# Patient Record
Sex: Female | Born: 1955 | Race: Black or African American | Hispanic: No | Marital: Single | Smoking: Never smoker
Health system: Southern US, Community
[De-identification: ages and names within clinical notes are randomized; demographics above are authoritative.]

## PROBLEM LIST (undated history)

## (undated) DIAGNOSIS — I10 Essential (primary) hypertension: Secondary | ICD-10-CM

## (undated) DIAGNOSIS — B20 Human immunodeficiency virus [HIV] disease: Secondary | ICD-10-CM

## (undated) DIAGNOSIS — Z21 Asymptomatic human immunodeficiency virus [HIV] infection status: Secondary | ICD-10-CM

## (undated) DIAGNOSIS — M1990 Unspecified osteoarthritis, unspecified site: Secondary | ICD-10-CM

## (undated) DIAGNOSIS — K759 Inflammatory liver disease, unspecified: Secondary | ICD-10-CM

## (undated) HISTORY — PX: ABDOMINAL HYSTERECTOMY: SHX81

---

## 2007-10-03 ENCOUNTER — Emergency Department (HOSPITAL_COMMUNITY): Admission: EM | Admit: 2007-10-03 | Discharge: 2007-10-03 | Payer: Self-pay | Admitting: Emergency Medicine

## 2010-01-23 ENCOUNTER — Observation Stay (HOSPITAL_COMMUNITY): Admission: EM | Admit: 2010-01-23 | Discharge: 2010-01-24 | Payer: Self-pay | Admitting: Emergency Medicine

## 2010-11-15 LAB — URINALYSIS, ROUTINE W REFLEX MICROSCOPIC
Nitrite: NEGATIVE
Protein, ur: 30 mg/dL — AB

## 2010-11-15 LAB — POCT CARDIAC MARKERS
CKMB, poc: 1 ng/mL (ref 1.0–8.0)
CKMB, poc: <1 ng/mL — ABNORMAL LOW (ref 1.0–8.0)
Myoglobin, poc: 118 ng/mL (ref 12–200)

## 2010-11-15 LAB — COMPREHENSIVE METABOLIC PANEL
ALT: 26 U/L (ref 0–35)
Alkaline Phosphatase: 100 U/L (ref 39–117)
BUN: 7 mg/dL (ref 6–23)
GFR calc non Af Amer: >60 mL/min (ref >60)
Potassium: 3.4 mEq/L — ABNORMAL LOW (ref 3.5–5.1)
Sodium: 135 mEq/L (ref 135–145)
Total Bilirubin: 0.1 mg/dL — ABNORMAL LOW (ref 0.3–1.2)

## 2010-11-15 LAB — POCT I-STAT, CHEM 8
Glucose, Bld: 112 mg/dL — ABNORMAL HIGH (ref 70–99)
HCT: 44 % (ref 36.0–46.0)
Hemoglobin: 15 g/dL (ref 12.0–15.0)
Potassium: 3.7 mEq/L (ref 3.5–5.1)
Sodium: 140 mEq/L (ref 135–145)

## 2010-11-15 LAB — CBC
Hemoglobin: 13.4 g/dL (ref 12.0–15.0)
MCHC: 34.1 g/dL (ref 30.0–36.0)
MCV: 92.3 fL (ref 78.0–100.0)
RBC: 4.25 MIL/uL (ref 3.87–5.11)
WBC: 4.9 10*3/uL (ref 4.0–10.5)

## 2010-11-15 LAB — SALICYLATE LEVEL: Salicylate Lvl: <4 mg/dL (ref 2.8–20.0)

## 2010-11-15 LAB — LIPID PANEL
Total CHOL/HDL Ratio: 2.4 RATIO
VLDL: 13 mg/dL (ref 0–40)

## 2010-11-15 LAB — HEMOGLOBIN A1C: Hgb A1c MFr Bld: 5.8 % — ABNORMAL HIGH (ref <5.7)

## 2010-11-15 LAB — RAPID URINE DRUG SCREEN, HOSP PERFORMED
Barbiturates: NOT DETECTED
Opiates: POSITIVE — AB
Tetrahydrocannabinol: NOT DETECTED

## 2010-11-15 LAB — URINE CULTURE
Colony Count: NO GROWTH
Culture: NO GROWTH

## 2010-11-15 LAB — DIFFERENTIAL
Basophils Relative: 0 % (ref 0–1)
Eosinophils Absolute: 0 10*3/uL (ref 0.0–0.7)
Eosinophils Relative: 1 % (ref 0–5)
Lymphocytes Relative: 26 % (ref 12–46)
Neutro Abs: 3.4 10*3/uL (ref 1.7–7.7)
Neutrophils Relative %: 69 % (ref 43–77)

## 2010-11-15 LAB — URINE MICROSCOPIC-ADD ON

## 2010-11-15 LAB — CARDIAC PANEL(CRET KIN+CKTOT+MB+TROPI)
CK, MB: 0.9 ng/mL (ref 0.3–4.0)
Total CK: 107 U/L (ref 7–177)

## 2010-11-15 LAB — ETHANOL: Alcohol, Ethyl (B): 190 mg/dL — ABNORMAL HIGH (ref 0–10)

## 2010-11-15 LAB — APTT: aPTT: 28 seconds (ref 24–37)

## 2013-11-28 ENCOUNTER — Other Ambulatory Visit: Payer: Self-pay | Admitting: Pain Medicine

## 2013-11-28 DIAGNOSIS — M545 Low back pain, unspecified: Secondary | ICD-10-CM

## 2013-11-30 ENCOUNTER — Ambulatory Visit
Admission: RE | Admit: 2013-11-30 | Discharge: 2013-11-30 | Disposition: A | Discharge status: Home / Self Care | Payer: Managed Care, Other (non HMO) | Source: Ambulatory Visit | Attending: Pain Medicine | Admitting: Pain Medicine

## 2013-11-30 DIAGNOSIS — M545 Low back pain, unspecified: Secondary | ICD-10-CM

## 2013-12-09 ENCOUNTER — Other Ambulatory Visit: Payer: Self-pay | Admitting: Orthopedic Surgery

## 2013-12-20 ENCOUNTER — Ambulatory Visit (HOSPITAL_COMMUNITY)
Admission: RE | Admit: 2013-12-20 | Discharge: 2013-12-20 | Disposition: A | Discharge status: Home / Self Care | Payer: Managed Care, Other (non HMO) | Source: Ambulatory Visit | Attending: Orthopedic Surgery | Admitting: Orthopedic Surgery

## 2013-12-20 ENCOUNTER — Encounter (HOSPITAL_COMMUNITY): Payer: Self-pay

## 2013-12-20 ENCOUNTER — Encounter (HOSPITAL_COMMUNITY)
Admission: RE | Admit: 2013-12-20 | Discharge: 2013-12-20 | Disposition: A | Discharge status: Home / Self Care | Payer: Managed Care, Other (non HMO) | Source: Ambulatory Visit | Attending: Orthopedic Surgery | Admitting: Orthopedic Surgery

## 2013-12-20 DIAGNOSIS — Z01812 Encounter for preprocedural laboratory examination: Secondary | ICD-10-CM | POA: Insufficient documentation

## 2013-12-20 DIAGNOSIS — Z01810 Encounter for preprocedural cardiovascular examination: Secondary | ICD-10-CM | POA: Insufficient documentation

## 2013-12-20 DIAGNOSIS — Z01818 Encounter for other preprocedural examination: Secondary | ICD-10-CM | POA: Insufficient documentation

## 2013-12-20 HISTORY — DX: Essential (primary) hypertension: I10

## 2013-12-20 HISTORY — DX: Inflammatory liver disease, unspecified: K75.9

## 2013-12-20 HISTORY — DX: Unspecified osteoarthritis, unspecified site: M19.90

## 2013-12-20 HISTORY — DX: Human immunodeficiency virus (HIV) disease: B20

## 2013-12-20 HISTORY — DX: Asymptomatic human immunodeficiency virus (hiv) infection status: Z21

## 2013-12-20 LAB — COMPREHENSIVE METABOLIC PANEL
ALT: 36 U/L — ABNORMAL HIGH (ref 0–35)
AST: 60 U/L — AB (ref 0–37)
Albumin: 3.2 g/dL — ABNORMAL LOW (ref 3.5–5.2)
Alkaline Phosphatase: 124 U/L — ABNORMAL HIGH (ref 39–117)
BUN: 11 mg/dL (ref 6–23)
CALCIUM: 9.7 mg/dL (ref 8.4–10.5)
CO2: 29 meq/L (ref 19–32)
CREATININE: 0.7 mg/dL (ref 0.50–1.10)
Chloride: 100 mEq/L (ref 96–112)
Glucose, Bld: 87 mg/dL (ref 70–99)
Potassium: 4.4 mEq/L (ref 3.7–5.3)
Sodium: 138 mEq/L (ref 137–147)
Total Bilirubin: <0.2 mg/dL — ABNORMAL LOW (ref 0.3–1.2)
Total Protein: 9.1 g/dL — ABNORMAL HIGH (ref 6.0–8.3)

## 2013-12-20 LAB — PROTIME-INR
INR: 1.02 (ref 0.00–1.49)
Prothrombin Time: 13.2 seconds (ref 11.6–15.2)

## 2013-12-20 LAB — URINALYSIS, ROUTINE W REFLEX MICROSCOPIC
Bilirubin Urine: NEGATIVE
Glucose, UA: NEGATIVE mg/dL
Ketones, ur: NEGATIVE mg/dL
Leukocytes, UA: NEGATIVE
Nitrite: NEGATIVE
Protein, ur: NEGATIVE mg/dL
SPECIFIC GRAVITY, URINE: 1.01 (ref 1.005–1.030)
UROBILINOGEN UA: 0.2 mg/dL (ref 0.0–1.0)
pH: 7.5 (ref 5.0–8.0)

## 2013-12-20 LAB — CBC WITH DIFFERENTIAL/PLATELET
BASOS ABS: 0 10*3/uL (ref 0.0–0.1)
Basophils Relative: 0 % (ref 0–1)
EOS PCT: 2 % (ref 0–5)
Eosinophils Absolute: 0.1 10*3/uL (ref 0.0–0.7)
HEMATOCRIT: 38.7 % (ref 36.0–46.0)
Hemoglobin: 13.1 g/dL (ref 12.0–15.0)
LYMPHS PCT: 38 % (ref 12–46)
Lymphs Abs: 2.5 10*3/uL (ref 0.7–4.0)
MCH: 32.6 pg (ref 26.0–34.0)
MCHC: 33.9 g/dL (ref 30.0–36.0)
MCV: 96.3 fL (ref 78.0–100.0)
MONO ABS: 0.6 10*3/uL (ref 0.1–1.0)
Monocytes Relative: 9 % (ref 3–12)
Neutro Abs: 3.4 10*3/uL (ref 1.7–7.7)
Neutrophils Relative %: 51 % (ref 43–77)
Platelets: 230 10*3/uL (ref 150–400)
RBC: 4.02 MIL/uL (ref 3.87–5.11)
RDW: 14.3 % (ref 11.5–15.5)
WBC: 6.6 10*3/uL (ref 4.0–10.5)

## 2013-12-20 LAB — SURGICAL PCR SCREEN
MRSA, PCR: NEGATIVE
STAPHYLOCOCCUS AUREUS: NEGATIVE

## 2013-12-20 LAB — URINE MICROSCOPIC-ADD ON

## 2013-12-20 LAB — TYPE AND SCREEN
ABO/RH(D): O POS
Antibody Screen: NEGATIVE

## 2013-12-20 LAB — ABO/RH: ABO/RH(D): O POS

## 2013-12-20 LAB — APTT: aPTT: 35 seconds (ref 24–37)

## 2013-12-20 NOTE — Pre-Procedure Instructions (Addendum)
Flara Hammerschmidt  12/20/2013   Your procedure is scheduled on:  Thursday, April 30.  [facility] / Entrance "A" at 5:30 AM.  Call this number if you have problems the morning of surgery: [phone]   Remember:   Do not eat food or drink liquids after midnight Wednesday, April 29.   Take these medicines the morning of surgery with A SIP OF WATER: - Amlodipine.  Take if needed Pain medication.   Do not wear jewelry, make-up or nail polish.  Do not wear lotions, powders, or perfumes.  Do not shave 48 hours prior to surgery.   Do not bring valuables to the hospital.  [facility] is not responsible  for any belongings or valuables.               Contacts, dentures or bridgework may not be worn into surgery.  Leave suitcase in the car. After surgery it may be brought to your room.  For patients admitted to the hospital, discharge time is determined by your  treatment team.                 Special Instructions: Review  [facility] - Preparing For Surgery.   Please read over the following fact sheets that you were given: Pain Booklet, Coughing and Deep Breathing, Blood Transfusion Information and Surgical Site Infection Prevention, Incentive Spirometry

## 2013-12-25 MED ORDER — CEFAZOLIN SODIUM-DEXTROSE 2-3 GM-% IV SOLR
2.0000 g | INTRAVENOUS | Status: AC
  Administered 2013-12-26 (×2): 2 g via INTRAVENOUS
  Filled 2013-12-25: qty 50

## 2013-12-25 MED ORDER — POVIDONE-IODINE 7.5 % EX SOLN
Freq: Once | CUTANEOUS | Status: DC
  Filled 2013-12-25: qty 118

## 2013-12-25 NOTE — H&P (Signed)
PREOPERATIVE H&P  Chief Complaint: left > right leg pain  HPI: [name] is a 58 y.o. female who presents with ongoing leg pain for 3 years. The patient has failed multiple forms of conservative care. MRI show DDD at 5/1 and L > R stenosis.   Past Medical History  Diagnosis Date  . Hypertension   . Arthritis   . Hepatitis   . HIV (human immunodeficiency virus infection)    Past Surgical History  Procedure Laterality Date  . Abdominal hysterectomy     History   Social History  . Marital Status: Single    Spouse Name: N/A    Number of Children: N/A  . Years of Education: N/A   Social History Main Topics  . Smoking status: Never Smoker   . Smokeless tobacco: Not on file  . Alcohol Use: No  . Drug Use: No  . Sexual Activity: Not on file   Other Topics Concern  . Not on file   Social History Narrative  . No narrative on file   No family history on file. Allergies  Allergen Reactions  . Sulfa Antibiotics Other (See Comments)    Face turned red and made her feel very uncomfortable   Prior to Admission medications   Medication Sig Start Date End Date Taking? Authorizing Provider  amLODipine (NORVASC) 5 MG tablet Take 5 mg by mouth every morning.    Historical Provider, MD  Ascorbic Acid (VITAMIN C PO) Take 1 tablet by mouth daily.    Historical Provider, MD  azaTHIOprine (IMURAN) 50 MG tablet Take 50 mg by mouth 2 (two) times daily.    Historical Provider, MD  benazepril-hydrochlorthiazide (LOTENSIN HCT) 20-12.5 MG per tablet Take 2 tablets by mouth daily with breakfast.    Historical Provider, MD  Brimonidine Tartrate-Timolol (COMBIGAN OP) Place 1 drop into both eyes 2 (two) times daily.    Historical Provider, MD  efavirenz-emtricitabine-tenofovir (ATRIPLA) 600-200-300 MG per tablet Take 1 tablet by mouth at bedtime.    Historical Provider, MD  estrogens, conjugated, (PREMARIN) 0.625 MG tablet Take 0.625 mg by mouth daily. Take daily for 21 days then do not take  for 7 days.    Historical Provider, MD  fexofenadine (ALLEGRA) 180 MG tablet Take 180 mg by mouth daily as needed for allergies or rhinitis.    Historical Provider, MD  fluticasone (FLONASE) 50 MCG/ACT nasal spray Place 2 sprays into both nostrils daily as needed for allergies or rhinitis.    Historical Provider, MD  predniSONE (DELTASONE) 2.5 MG tablet Take 2.5 mg by mouth every morning.    Historical Provider, MD  risedronate (ACTONEL) 35 MG tablet Take 35 mg by mouth every 7 (seven) days. with water on empty stomach, nothing by mouth or lie down for next 30 minutes.    Historical Provider, MD  Tapentadol HCl (NUCYNTA ER) 100 MG TB12 Take 50-100 mg by mouth every 8 (eight) hours.    Historical Provider, MD  Tapentadol HCl (NUCYNTA) 100 MG TABS Take 1 tablet by mouth every 12 (twelve) hours.    Historical Provider, MD     All other systems have been reviewed and were otherwise negative with the exception of those mentioned in the HPI and as above.  Physical Exam: There were no vitals filed for this visit.  General: Alert, no acute distress Cardiovascular: No pedal edema Respiratory: No cyanosis, no use of accessory musculature GI: No organomegaly, abdomen is soft and non-tender Skin: No lesions in the area of chief  complaint Neurologic: Sensation intact distally Psychiatric: Patient is competent for consent with normal mood and affect Lymphatic: No axillary or cervical lymphadenopathy  MUSCULOSKELETAL: + TTP low back  Assessment/Plan: Radiculopathy Plan for Procedure(s): POSTERIOR LUMBAR FUSION 1 LEVEL   Emilee HeroMark Leonard Danylah Holden, MD 12/25/2013 5:30 PM

## 2013-12-26 ENCOUNTER — Encounter (HOSPITAL_COMMUNITY)
Admission: RE | Disposition: A | Discharge status: Home Health | Payer: Self-pay | Source: Ambulatory Visit | Attending: Orthopedic Surgery

## 2013-12-26 ENCOUNTER — Inpatient Hospital Stay (HOSPITAL_COMMUNITY): Payer: Managed Care, Other (non HMO) | Admitting: Anesthesiology

## 2013-12-26 ENCOUNTER — Encounter (HOSPITAL_COMMUNITY): Payer: Self-pay

## 2013-12-26 ENCOUNTER — Inpatient Hospital Stay (HOSPITAL_COMMUNITY)
Admission: RE | Admit: 2013-12-26 | Discharge: 2013-12-31 | DRG: 460 | Disposition: A | Discharge status: Home Health | Payer: Managed Care, Other (non HMO) | Source: Ambulatory Visit | Attending: Orthopedic Surgery | Admitting: Orthopedic Surgery

## 2013-12-26 ENCOUNTER — Inpatient Hospital Stay (HOSPITAL_COMMUNITY): Payer: Managed Care, Other (non HMO)

## 2013-12-26 ENCOUNTER — Encounter (HOSPITAL_COMMUNITY): Payer: Managed Care, Other (non HMO) | Admitting: Anesthesiology

## 2013-12-26 DIAGNOSIS — Z21 Asymptomatic human immunodeficiency virus [HIV] infection status: Secondary | ICD-10-CM | POA: Diagnosis present

## 2013-12-26 DIAGNOSIS — M129 Arthropathy, unspecified: Secondary | ICD-10-CM | POA: Diagnosis present

## 2013-12-26 DIAGNOSIS — I10 Essential (primary) hypertension: Secondary | ICD-10-CM | POA: Diagnosis present

## 2013-12-26 DIAGNOSIS — M51379 Other intervertebral disc degeneration, lumbosacral region without mention of lumbar back pain or lower extremity pain: Principal | ICD-10-CM | POA: Diagnosis present

## 2013-12-26 DIAGNOSIS — M5137 Other intervertebral disc degeneration, lumbosacral region: Principal | ICD-10-CM | POA: Diagnosis present

## 2013-12-26 DIAGNOSIS — M5126 Other intervertebral disc displacement, lumbar region: Secondary | ICD-10-CM | POA: Diagnosis present

## 2013-12-26 DIAGNOSIS — Z881 Allergy status to other antibiotic agents status: Secondary | ICD-10-CM

## 2013-12-26 DIAGNOSIS — IMO0002 Reserved for concepts with insufficient information to code with codable children: Secondary | ICD-10-CM

## 2013-12-26 DIAGNOSIS — K759 Inflammatory liver disease, unspecified: Secondary | ICD-10-CM | POA: Diagnosis present

## 2013-12-26 DIAGNOSIS — Z79899 Other long term (current) drug therapy: Secondary | ICD-10-CM

## 2013-12-26 DIAGNOSIS — M5410 Radiculopathy, site unspecified: Secondary | ICD-10-CM | POA: Diagnosis present

## 2013-12-26 SURGERY — POSTERIOR LUMBAR FUSION 1 LEVEL
Anesthesia: General | Laterality: Left

## 2013-12-26 MED ORDER — ACETAMINOPHEN 650 MG RE SUPP: 650.0000 mg | RECTAL | Status: DC | PRN

## 2013-12-26 MED ORDER — BRIMONIDINE TARTRATE-TIMOLOL 0.2-0.5 % OP SOLN: 1.0000 [drp] | Freq: Two times a day (BID) | OPHTHALMIC | Status: DC

## 2013-12-26 MED ORDER — PHENOL 1.4 % MT LIQD
1.0000 | OROMUCOSAL | Status: DC | PRN
Start: 2013-12-26 — End: 2013-12-31

## 2013-12-26 MED ORDER — LIDOCAINE HCL (CARDIAC) 20 MG/ML IV SOLN
INTRAVENOUS | Status: DC | PRN
  Administered 2013-12-26: 100 mg via INTRAVENOUS

## 2013-12-26 MED ORDER — SODIUM CHLORIDE 0.9 % IJ SOLN: 9.0000 mL | INTRAMUSCULAR | Status: DC | PRN

## 2013-12-26 MED ORDER — PROPOFOL 10 MG/ML IV BOLUS
INTRAVENOUS | Status: AC
  Filled 2013-12-26: qty 20

## 2013-12-26 MED ORDER — METHYLENE BLUE 1 % INJ SOLN
INTRAMUSCULAR | Status: AC
  Filled 2013-12-26: qty 10

## 2013-12-26 MED ORDER — VECURONIUM BROMIDE 10 MG IV SOLR
INTRAVENOUS | Status: AC
  Filled 2013-12-26: qty 10

## 2013-12-26 MED ORDER — PROPOFOL 10 MG/ML IV BOLUS
INTRAVENOUS | Status: DC | PRN
  Administered 2013-12-26: 140 mg via INTRAVENOUS
  Administered 2013-12-26 (×2): 30 mg via INTRAVENOUS

## 2013-12-26 MED ORDER — ACETAMINOPHEN 325 MG PO TABS: 650.0000 mg | ORAL_TABLET | ORAL | Status: DC | PRN

## 2013-12-26 MED ORDER — NEOSTIGMINE METHYLSULFATE 10 MG/10ML IV SOLN
INTRAVENOUS | Status: DC | PRN
  Administered 2013-12-26: 2 mg via INTRAVENOUS

## 2013-12-26 MED ORDER — BUPIVACAINE-EPINEPHRINE (PF) 0.25% -1:200000 IJ SOLN
INTRAMUSCULAR | Status: AC
  Filled 2013-12-26: qty 30

## 2013-12-26 MED ORDER — BENAZEPRIL HCL 40 MG PO TABS
40.0000 mg | ORAL_TABLET | Freq: Every day | ORAL | Status: DC
  Administered 2013-12-27 – 2013-12-31 (×5): 40 mg via ORAL
  Filled 2013-12-26 (×5): qty 1

## 2013-12-26 MED ORDER — ALUM & MAG HYDROXIDE-SIMETH 200-200-20 MG/5ML PO SUSP: 30.0000 mL | Freq: Four times a day (QID) | ORAL | Status: DC | PRN

## 2013-12-26 MED ORDER — STERILE WATER FOR INJECTION IJ SOLN
INTRAMUSCULAR | Status: AC
  Filled 2013-12-26: qty 10

## 2013-12-26 MED ORDER — DIPHENHYDRAMINE HCL 50 MG/ML IJ SOLN: 12.5000 mg | Freq: Four times a day (QID) | INTRAMUSCULAR | Status: DC | PRN

## 2013-12-26 MED ORDER — SODIUM CHLORIDE 0.9 % IV SOLN
INTRAVENOUS | Status: DC
  Administered 2013-12-26: 18:00:00 via INTRAVENOUS

## 2013-12-26 MED ORDER — ONDANSETRON HCL 4 MG/2ML IJ SOLN
INTRAMUSCULAR | Status: DC | PRN
  Administered 2013-12-26: 4 mg via INTRAVENOUS

## 2013-12-26 MED ORDER — BRIMONIDINE TARTRATE 0.2 % OP SOLN
1.0000 [drp] | Freq: Two times a day (BID) | OPHTHALMIC | Status: DC
Start: 2013-12-26 — End: 2013-12-31
  Administered 2013-12-26 – 2013-12-31 (×10): 1 [drp] via OPHTHALMIC
  Filled 2013-12-26: qty 5

## 2013-12-26 MED ORDER — EFAVIRENZ-EMTRICITAB-TENOFOVIR 600-200-300 MG PO TABS
1.0000 | ORAL_TABLET | Freq: Every day | ORAL | Status: DC
  Administered 2013-12-26 – 2013-12-30 (×5): 1 via ORAL
  Filled 2013-12-26 (×6): qty 1

## 2013-12-26 MED ORDER — MIDAZOLAM HCL 2 MG/2ML IJ SOLN
INTRAMUSCULAR | Status: AC
  Filled 2013-12-26: qty 2

## 2013-12-26 MED ORDER — ACETAMINOPHEN 325 MG PO TABS: 325.0000 mg | ORAL_TABLET | ORAL | Status: DC | PRN

## 2013-12-26 MED ORDER — FENTANYL CITRATE 0.05 MG/ML IJ SOLN
INTRAMUSCULAR | Status: AC
  Filled 2013-12-26: qty 5

## 2013-12-26 MED ORDER — SODIUM CHLORIDE 0.9 % IJ SOLN
INTRAMUSCULAR | Status: AC
  Filled 2013-12-26: qty 10

## 2013-12-26 MED ORDER — LACTATED RINGERS IV SOLN
INTRAVENOUS | Status: DC | PRN
  Administered 2013-12-26 (×2): via INTRAVENOUS

## 2013-12-26 MED ORDER — THROMBIN 20000 UNITS EX SOLR
CUTANEOUS | Status: DC | PRN
  Administered 2013-12-26: 11:00:00 via TOPICAL

## 2013-12-26 MED ORDER — PHENYLEPHRINE HCL 10 MG/ML IJ SOLN
INTRAMUSCULAR | Status: DC | PRN
  Administered 2013-12-26 (×2): 40 ug via INTRAVENOUS
  Administered 2013-12-26: 80 ug via INTRAVENOUS
  Administered 2013-12-26: 120 ug via INTRAVENOUS
  Administered 2013-12-26: 80 ug via INTRAVENOUS

## 2013-12-26 MED ORDER — ONDANSETRON HCL 4 MG/2ML IJ SOLN: 4.0000 mg | Freq: Four times a day (QID) | INTRAMUSCULAR | Status: DC | PRN

## 2013-12-26 MED ORDER — THROMBIN 20000 UNITS EX KIT
PACK | CUTANEOUS | Status: DC | PRN
  Administered 2013-12-26: 20000 [IU] via TOPICAL

## 2013-12-26 MED ORDER — PREDNISONE 2.5 MG PO TABS
2.5000 mg | ORAL_TABLET | Freq: Every day | ORAL | Status: DC
  Administered 2013-12-27 – 2013-12-31 (×5): 2.5 mg via ORAL
  Filled 2013-12-26 (×7): qty 1

## 2013-12-26 MED ORDER — FLUTICASONE PROPIONATE 50 MCG/ACT NA SUSP
2.0000 | Freq: Every day | NASAL | Status: DC | PRN
  Filled 2013-12-26 (×2): qty 16

## 2013-12-26 MED ORDER — SODIUM CHLORIDE 0.9 % IV SOLN: 250.0000 mL | INTRAVENOUS | Status: DC

## 2013-12-26 MED ORDER — ARTIFICIAL TEARS OP OINT
TOPICAL_OINTMENT | OPHTHALMIC | Status: AC
  Filled 2013-12-26: qty 3.5

## 2013-12-26 MED ORDER — HYDROMORPHONE HCL PF 1 MG/ML IJ SOLN
INTRAMUSCULAR | Status: AC
  Filled 2013-12-26: qty 1

## 2013-12-26 MED ORDER — BENAZEPRIL-HYDROCHLOROTHIAZIDE 20-12.5 MG PO TABS: 2.0000 | ORAL_TABLET | Freq: Every day | ORAL | Status: DC

## 2013-12-26 MED ORDER — LACTATED RINGERS IV SOLN
INTRAVENOUS | Status: DC | PRN
  Administered 2013-12-26: 08:00:00 via INTRAVENOUS

## 2013-12-26 MED ORDER — ARTIFICIAL TEARS OP OINT
TOPICAL_OINTMENT | OPHTHALMIC | Status: DC | PRN
  Administered 2013-12-26: 1 via OPHTHALMIC

## 2013-12-26 MED ORDER — MENTHOL 3 MG MT LOZG
1.0000 | LOZENGE | OROMUCOSAL | Status: DC | PRN
Start: 2013-12-26 — End: 2013-12-31

## 2013-12-26 MED ORDER — OXYCODONE HCL 5 MG/5ML PO SOLN: 5.0000 mg | Freq: Once | ORAL | Status: AC | PRN

## 2013-12-26 MED ORDER — PROMETHAZINE HCL 25 MG/ML IJ SOLN: 6.2500 mg | INTRAMUSCULAR | Status: DC | PRN

## 2013-12-26 MED ORDER — OXYCODONE HCL 5 MG PO TABS
ORAL_TABLET | ORAL | Status: AC
  Filled 2013-12-26: qty 1

## 2013-12-26 MED ORDER — PHENYLEPHRINE HCL 10 MG/ML IJ SOLN
10.0000 mg | INTRAMUSCULAR | Status: DC | PRN
  Administered 2013-12-26: 30 ug/min via INTRAVENOUS

## 2013-12-26 MED ORDER — SUCCINYLCHOLINE CHLORIDE 20 MG/ML IJ SOLN
INTRAMUSCULAR | Status: AC
  Filled 2013-12-26: qty 1

## 2013-12-26 MED ORDER — MORPHINE SULFATE (PF) 1 MG/ML IV SOLN
INTRAVENOUS | Status: AC
  Filled 2013-12-26: qty 25

## 2013-12-26 MED ORDER — DIPHENHYDRAMINE HCL 12.5 MG/5ML PO ELIX: 12.5000 mg | ORAL_SOLUTION | Freq: Four times a day (QID) | ORAL | Status: DC | PRN

## 2013-12-26 MED ORDER — ONDANSETRON HCL 4 MG/2ML IJ SOLN
4.0000 mg | INTRAMUSCULAR | Status: DC | PRN
  Administered 2013-12-27 – 2013-12-31 (×13): 4 mg via INTRAVENOUS
  Filled 2013-12-26 (×13): qty 2

## 2013-12-26 MED ORDER — EPHEDRINE SULFATE 50 MG/ML IJ SOLN
INTRAMUSCULAR | Status: AC
  Filled 2013-12-26: qty 1

## 2013-12-26 MED ORDER — ROCURONIUM BROMIDE 100 MG/10ML IV SOLN
INTRAVENOUS | Status: DC | PRN
  Administered 2013-12-26: 20 mg via INTRAVENOUS
  Administered 2013-12-26: 30 mg via INTRAVENOUS

## 2013-12-26 MED ORDER — METHYLENE BLUE 1 % INJ SOLN
INTRAMUSCULAR | Status: DC | PRN
  Administered 2013-12-26: .3 mL via SUBMUCOSAL

## 2013-12-26 MED ORDER — PROPOFOL INFUSION 10 MG/ML OPTIME
INTRAVENOUS | Status: DC | PRN
  Administered 2013-12-26: 10:00:00 via INTRAVENOUS
  Administered 2013-12-26: 50 ug/kg/min via INTRAVENOUS

## 2013-12-26 MED ORDER — ONDANSETRON HCL 4 MG/2ML IJ SOLN
INTRAMUSCULAR | Status: AC
  Filled 2013-12-26: qty 2

## 2013-12-26 MED ORDER — THROMBIN 20000 UNITS EX SOLR
CUTANEOUS | Status: AC
  Filled 2013-12-26: qty 20000

## 2013-12-26 MED ORDER — MORPHINE SULFATE 2 MG/ML IJ SOLN: 1.0000 mg | INTRAMUSCULAR | Status: DC | PRN

## 2013-12-26 MED ORDER — MORPHINE SULFATE (PF) 1 MG/ML IV SOLN
INTRAVENOUS | Status: DC
  Administered 2013-12-26: 13:00:00 via INTRAVENOUS
  Administered 2013-12-26: 12 mg via INTRAVENOUS
  Administered 2013-12-27: 03:00:00 via INTRAVENOUS
  Administered 2013-12-27: 4.5 mg via INTRAVENOUS
  Administered 2013-12-27: 12 mg via INTRAVENOUS
  Administered 2013-12-27: 7.5 mg via INTRAVENOUS
  Filled 2013-12-26: qty 25

## 2013-12-26 MED ORDER — 0.9 % SODIUM CHLORIDE (POUR BTL) OPTIME
TOPICAL | Status: DC | PRN
  Administered 2013-12-26 (×2): 1000 mL

## 2013-12-26 MED ORDER — HYDROCHLOROTHIAZIDE 25 MG PO TABS
25.0000 mg | ORAL_TABLET | Freq: Every day | ORAL | Status: DC
  Administered 2013-12-27 – 2013-12-31 (×5): 25 mg via ORAL
  Filled 2013-12-26 (×5): qty 1

## 2013-12-26 MED ORDER — SODIUM CHLORIDE 0.9 % IJ SOLN: 3.0000 mL | INTRAMUSCULAR | Status: DC | PRN

## 2013-12-26 MED ORDER — TIMOLOL MALEATE 0.5 % OP SOLN
1.0000 [drp] | Freq: Two times a day (BID) | OPHTHALMIC | Status: DC
  Administered 2013-12-26 – 2013-12-31 (×9): 1 [drp] via OPHTHALMIC
  Filled 2013-12-26: qty 5

## 2013-12-26 MED ORDER — CEFAZOLIN SODIUM-DEXTROSE 2-3 GM-% IV SOLR
INTRAVENOUS | Status: AC
  Filled 2013-12-26: qty 50

## 2013-12-26 MED ORDER — HYDROMORPHONE HCL PF 1 MG/ML IJ SOLN
0.2500 mg | INTRAMUSCULAR | Status: DC | PRN
Start: 2013-12-26 — End: 2013-12-26
  Administered 2013-12-26 (×4): 0.5 mg via INTRAVENOUS

## 2013-12-26 MED ORDER — CEFAZOLIN SODIUM 1-5 GM-% IV SOLN
1.0000 g | Freq: Three times a day (TID) | INTRAVENOUS | Status: AC
  Administered 2013-12-26 – 2013-12-27 (×2): 1 g via INTRAVENOUS
  Filled 2013-12-26 (×3): qty 50

## 2013-12-26 MED ORDER — AMLODIPINE BESYLATE 5 MG PO TABS
5.0000 mg | ORAL_TABLET | Freq: Every morning | ORAL | Status: DC
  Administered 2013-12-27 – 2013-12-31 (×5): 5 mg via ORAL
  Filled 2013-12-26 (×5): qty 1

## 2013-12-26 MED ORDER — OXYCODONE HCL 5 MG PO TABS
10.0000 mg | ORAL_TABLET | ORAL | Status: DC | PRN
  Administered 2013-12-27 – 2013-12-29 (×10): 10 mg via ORAL
  Filled 2013-12-26 (×10): qty 2

## 2013-12-26 MED ORDER — MIDAZOLAM HCL 5 MG/5ML IJ SOLN
INTRAMUSCULAR | Status: DC | PRN
  Administered 2013-12-26: 2 mg via INTRAVENOUS

## 2013-12-26 MED ORDER — GLYCOPYRROLATE 0.2 MG/ML IJ SOLN
INTRAMUSCULAR | Status: AC
  Filled 2013-12-26: qty 1

## 2013-12-26 MED ORDER — BUPIVACAINE-EPINEPHRINE 0.25% -1:200000 IJ SOLN
INTRAMUSCULAR | Status: DC | PRN
  Administered 2013-12-26: 10 mL

## 2013-12-26 MED ORDER — OXYCODONE HCL 5 MG PO TABS
5.0000 mg | ORAL_TABLET | Freq: Once | ORAL | Status: AC | PRN
  Administered 2013-12-26: 5 mg via ORAL

## 2013-12-26 MED ORDER — ESTROGENS CONJUGATED 0.625 MG PO TABS
0.6250 mg | ORAL_TABLET | Freq: Every day | ORAL | Status: DC
  Administered 2013-12-27 – 2013-12-31 (×5): 0.625 mg via ORAL
  Filled 2013-12-26 (×5): qty 1

## 2013-12-26 MED ORDER — SODIUM CHLORIDE 0.9 % IJ SOLN
3.0000 mL | Freq: Two times a day (BID) | INTRAMUSCULAR | Status: DC
  Administered 2013-12-27 – 2013-12-31 (×8): 3 mL via INTRAVENOUS

## 2013-12-26 MED ORDER — VECURONIUM BROMIDE 10 MG IV SOLR
INTRAVENOUS | Status: DC | PRN
  Administered 2013-12-26: 2 mg via INTRAVENOUS

## 2013-12-26 MED ORDER — OXYCODONE HCL ER 15 MG PO T12A
15.0000 mg | EXTENDED_RELEASE_TABLET | Freq: Two times a day (BID) | ORAL | Status: DC
  Administered 2013-12-26 – 2013-12-30 (×9): 15 mg via ORAL
  Filled 2013-12-26 (×10): qty 1

## 2013-12-26 MED ORDER — GLYCOPYRROLATE 0.2 MG/ML IJ SOLN
INTRAMUSCULAR | Status: DC | PRN
  Administered 2013-12-26: 0.3 mg via INTRAVENOUS

## 2013-12-26 MED ORDER — LORATADINE 10 MG PO TABS
10.0000 mg | ORAL_TABLET | Freq: Every day | ORAL | Status: DC
  Administered 2013-12-30: 10 mg via ORAL
  Filled 2013-12-26 (×5): qty 1

## 2013-12-26 MED ORDER — ACETAMINOPHEN 160 MG/5ML PO SOLN
325.0000 mg | ORAL | Status: DC | PRN
  Filled 2013-12-26: qty 20.3

## 2013-12-26 MED ORDER — LIDOCAINE HCL (CARDIAC) 20 MG/ML IV SOLN
INTRAVENOUS | Status: AC
  Filled 2013-12-26: qty 5

## 2013-12-26 MED ORDER — NALOXONE HCL 0.4 MG/ML IJ SOLN: 0.4000 mg | INTRAMUSCULAR | Status: DC | PRN

## 2013-12-26 MED ORDER — DIAZEPAM 5 MG PO TABS
5.0000 mg | ORAL_TABLET | Freq: Four times a day (QID) | ORAL | Status: DC | PRN
  Administered 2013-12-28 – 2013-12-31 (×4): 5 mg via ORAL
  Filled 2013-12-26 (×5): qty 1

## 2013-12-26 MED ORDER — FENTANYL CITRATE 0.05 MG/ML IJ SOLN
INTRAMUSCULAR | Status: DC | PRN
  Administered 2013-12-26: 150 ug via INTRAVENOUS
  Administered 2013-12-26 (×7): 50 ug via INTRAVENOUS

## 2013-12-26 MED ORDER — AZATHIOPRINE 50 MG PO TABS
50.0000 mg | ORAL_TABLET | Freq: Two times a day (BID) | ORAL | Status: DC
  Administered 2013-12-26 – 2013-12-31 (×10): 50 mg via ORAL
  Filled 2013-12-26 (×13): qty 1

## 2013-12-26 MED ORDER — RISEDRONATE SODIUM 5 MG PO TABS: 35.0000 mg | ORAL_TABLET | ORAL | Status: DC

## 2013-12-26 MED ORDER — ROCURONIUM BROMIDE 50 MG/5ML IV SOLN
INTRAVENOUS | Status: AC
  Filled 2013-12-26: qty 1

## 2013-12-26 SURGICAL SUPPLY — 84 items
BENZOIN TINCTURE PRP APPL 2/3 (GAUZE/BANDAGES/DRESSINGS) ×3 IMPLANT
BLADE 10 SAFETY STRL DISP (BLADE) IMPLANT
BLADE SURG ROTATE 9660 (MISCELLANEOUS) IMPLANT
BUR ROUND PRECISION 4.0 (BURR) ×2 IMPLANT
BUR ROUND PRECISION 4.0MM (BURR) ×1
CAGE CONCORDE BULLET 9X7X27 (Cage) ×3 IMPLANT
CARTRIDGE OIL MAESTRO DRILL (MISCELLANEOUS) ×1 IMPLANT
CLOSURE STERI-STRIP 1/2X4 (GAUZE/BANDAGES/DRESSINGS) ×1
CLOSURE WOUND 1/2 X4 (GAUZE/BANDAGES/DRESSINGS) ×2
CLSR STERI-STRIP ANTIMIC 1/2X4 (GAUZE/BANDAGES/DRESSINGS) ×2 IMPLANT
CONT SPEC STER OR (MISCELLANEOUS) ×3 IMPLANT
CORDS BIPOLAR (ELECTRODE) ×3 IMPLANT
COVER MAYO STAND STRL (DRAPES) ×3 IMPLANT
COVER SURGICAL LIGHT HANDLE (MISCELLANEOUS) ×3 IMPLANT
DIFFUSER DRILL AIR PNEUMATIC (MISCELLANEOUS) ×3 IMPLANT
DRAIN CHANNEL 15F RND FF W/TCR (WOUND CARE) ×3 IMPLANT
DRAPE C-ARM 42X72 X-RAY (DRAPES) ×3 IMPLANT
DRAPE ORTHO SPLIT 77X108 STRL (DRAPES) ×2
DRAPE POUCH INSTRU U-SHP 10X18 (DRAPES) ×3 IMPLANT
DRAPE SURG 17X23 STRL (DRAPES) ×9 IMPLANT
DRAPE SURG ORHT 6 SPLT 77X108 (DRAPES) ×1 IMPLANT
DURAPREP 26ML APPLICATOR (WOUND CARE) ×3 IMPLANT
ELECT BLADE 4.0 EZ CLEAN MEGAD (MISCELLANEOUS) ×3
ELECT CAUTERY BLADE 6.4 (BLADE) ×3 IMPLANT
ELECT REM PT RETURN 9FT ADLT (ELECTROSURGICAL) ×3
ELECTRODE BLDE 4.0 EZ CLN MEGD (MISCELLANEOUS) ×1 IMPLANT
ELECTRODE REM PT RTRN 9FT ADLT (ELECTROSURGICAL) ×1 IMPLANT
EVACUATOR SILICONE 100CC (DRAIN) ×3 IMPLANT
GAUZE SPONGE 4X4 16PLY XRAY LF (GAUZE/BANDAGES/DRESSINGS) ×6 IMPLANT
GLOVE BIO SURGEON STRL SZ7 (GLOVE) ×6 IMPLANT
GLOVE BIO SURGEON STRL SZ8 (GLOVE) ×6 IMPLANT
GLOVE BIOGEL PI IND STRL 7.5 (GLOVE) ×2 IMPLANT
GLOVE BIOGEL PI IND STRL 8 (GLOVE) ×1 IMPLANT
GLOVE BIOGEL PI INDICATOR 7.5 (GLOVE) ×4
GLOVE BIOGEL PI INDICATOR 8 (GLOVE) ×2
GOWN STRL REUS W/ TWL LRG LVL3 (GOWN DISPOSABLE) ×2 IMPLANT
GOWN STRL REUS W/ TWL XL LVL3 (GOWN DISPOSABLE) ×1 IMPLANT
GOWN STRL REUS W/TWL LRG LVL3 (GOWN DISPOSABLE) ×4
GOWN STRL REUS W/TWL XL LVL3 (GOWN DISPOSABLE) ×2
IV CATH 14GX2 1/4 (CATHETERS) ×3 IMPLANT
KIT BASIN OR (CUSTOM PROCEDURE TRAY) ×3 IMPLANT
KIT POSITION SURG JACKSON T1 (MISCELLANEOUS) ×3 IMPLANT
KIT ROOM TURNOVER OR (KITS) ×3 IMPLANT
MARKER SKIN DUAL TIP RULER LAB (MISCELLANEOUS) ×3 IMPLANT
MIX DBX 10CC 35% BONE (Bone Implant) ×3 IMPLANT
NEEDLE 22X1 1/2 (OR ONLY) (NEEDLE) ×3 IMPLANT
NEEDLE BONE MARROW 8GX6 FENEST (NEEDLE) IMPLANT
NEEDLE HYPO 25GX1X1/2 BEV (NEEDLE) ×3 IMPLANT
NEEDLE SPNL 18GX3.5 QUINCKE PK (NEEDLE) ×6 IMPLANT
NEURO MONITORING STIM (LABOR (TRAVEL & OVERTIME)) ×3 IMPLANT
NS IRRIG 1000ML POUR BTL (IV SOLUTION) ×3 IMPLANT
OIL CARTRIDGE MAESTRO DRILL (MISCELLANEOUS) ×3
PACK LAMINECTOMY ORTHO (CUSTOM PROCEDURE TRAY) ×3 IMPLANT
PACK UNIVERSAL I (CUSTOM PROCEDURE TRAY) ×3 IMPLANT
PAD ARMBOARD 7.5X6 YLW CONV (MISCELLANEOUS) ×6 IMPLANT
PATTIES SURGICAL .5 X1 (DISPOSABLE) ×3 IMPLANT
PATTIES SURGICAL .5X1.5 (GAUZE/BANDAGES/DRESSINGS) ×3 IMPLANT
ROD PRE BENT EXP 40MM (Rod) ×3 IMPLANT
ROD PRE LORDOSED 5.5X45 (Rod) ×3 IMPLANT
SCREW EXPEDIUM POLYAXIAL 7X40M (Screw) ×3 IMPLANT
SCREW POLYAXIAL 7X45MM (Screw) ×9 IMPLANT
SCREW SET SINGLE INNER (Screw) ×12 IMPLANT
SPONGE GAUZE 4X4 12PLY (GAUZE/BANDAGES/DRESSINGS) ×3 IMPLANT
SPONGE GAUZE 4X4 12PLY STER LF (GAUZE/BANDAGES/DRESSINGS) ×3 IMPLANT
SPONGE INTESTINAL PEANUT (DISPOSABLE) ×3 IMPLANT
SPONGE SURGIFOAM ABS GEL 100 (HEMOSTASIS) ×3 IMPLANT
STRIP CLOSURE SKIN 1/2X4 (GAUZE/BANDAGES/DRESSINGS) ×4 IMPLANT
SURGIFLO TRUKIT (HEMOSTASIS) ×3 IMPLANT
SUT MNCRL AB 4-0 PS2 18 (SUTURE) ×6 IMPLANT
SUT VIC AB 0 CT1 18XCR BRD 8 (SUTURE) ×1 IMPLANT
SUT VIC AB 0 CT1 8-18 (SUTURE) ×2
SUT VIC AB 1 CT1 18XCR BRD 8 (SUTURE) ×1 IMPLANT
SUT VIC AB 1 CT1 8-18 (SUTURE) ×2
SUT VIC AB 2-0 CT2 18 VCP726D (SUTURE) ×3 IMPLANT
SYR 20CC LL (SYRINGE) ×3 IMPLANT
SYR BULB IRRIGATION 50ML (SYRINGE) ×3 IMPLANT
SYR CONTROL 10ML LL (SYRINGE) ×3 IMPLANT
SYR TB 1ML LUER SLIP (SYRINGE) ×3 IMPLANT
TAPE CLOTH SURG 4X10 WHT LF (GAUZE/BANDAGES/DRESSINGS) ×3 IMPLANT
TOWEL OR 17X24 6PK STRL BLUE (TOWEL DISPOSABLE) ×3 IMPLANT
TOWEL OR 17X26 10 PK STRL BLUE (TOWEL DISPOSABLE) ×3 IMPLANT
TRAY FOLEY CATH 16FRSI W/METER (SET/KITS/TRAYS/PACK) ×3 IMPLANT
WATER STERILE IRR 1000ML POUR (IV SOLUTION) ×3 IMPLANT
YANKAUER SUCT BULB TIP NO VENT (SUCTIONS) ×3 IMPLANT

## 2013-12-26 NOTE — Anesthesia Procedure Notes (Signed)
Procedure Name: Intubation Date/Time: 12/26/2013 8:00 AM Performed by: Lovie CholOCK, Izaias Krupka K Pre-anesthesia Checklist: Patient identified, Emergency Drugs available, Suction available, Patient being monitored and Timeout performed Patient Re-evaluated:Patient Re-evaluated prior to inductionOxygen Delivery Method: Circle system utilized Preoxygenation: Pre-oxygenation with 100% oxygen Intubation Type: IV induction Ventilation: Mask ventilation without difficulty Laryngoscope Size: Miller and 2 Grade View: Grade I Tube type: Oral Tube size: 7.5 mm Number of attempts: 1 Airway Equipment and Method: Stylet Placement Confirmation: ETT inserted through vocal cords under direct vision,  positive ETCO2,  CO2 detector and breath sounds checked- equal and bilateral Secured at: 23 cm Tube secured with: Tape Dental Injury: Teeth and Oropharynx as per pre-operative assessment  Comments: Soft bite block utilized.

## 2013-12-26 NOTE — Progress Notes (Addendum)
Pt stated she had not been told about risks benefits of blood and blood products but still wanted to signed consent for blood or blood products, stating I want it if I need it.

## 2013-12-26 NOTE — Anesthesia Postprocedure Evaluation (Signed)
  Anesthesia Post-op Note  Patient: [name]  Procedure(s) Performed: Procedure(s) with comments: POSTERIOR LUMBAR FUSION 1 LEVEL (Left) - Left sided lumbar 5-sacrum 1 transforaminal lumbar interbody fusion with instrumentation, allograft  Patient Location: PACU  Anesthesia Type:General  Level of Consciousness: awake and alert   Airway and Oxygen Therapy: Patient Spontanous Breathing and Patient connected to nasal cannula oxygen  Post-op Pain: mild  Post-op Assessment: Post-op Vital signs reviewed, Patient's Cardiovascular Status Stable, Respiratory Function Stable, Patent Airway, No signs of Nausea or vomiting and Pain level controlled  Post-op Vital Signs: Reviewed and stable  Last Vitals:  Filed Vitals:   12/26/13 1445  BP:   Pulse: 80  Temp: 36.8 C  Resp: 13    Complications: No apparent anesthesia complications

## 2013-12-26 NOTE — Progress Notes (Signed)
Utilization review completed.  

## 2013-12-26 NOTE — Anesthesia Preprocedure Evaluation (Addendum)
Anesthesia Evaluation  Patient identified by MRN, date of birth, ID band Patient awake    Reviewed: Allergy & Precautions, H&P , NPO status , Patient's Chart, lab work & pertinent test results  History of Anesthesia Complications Negative for: history of anesthetic complications  Airway Mallampati: II TM Distance: >3 FB Neck ROM: Full    Dental  (+) Teeth Intact,    Pulmonary neg pulmonary ROS,  breath sounds clear to auscultation        Cardiovascular hypertension, Pt. on medications Rhythm:Regular     Neuro/Psych Low back pain with left sided symptoms negative psych ROS   GI/Hepatic negative GI ROS, (+) Hepatitis -, Autoimmune  Endo/Other  negative endocrine ROS  Renal/GU negative Renal ROS     Musculoskeletal negative musculoskeletal ROS (+)   Abdominal   Peds  Hematology HIV   Anesthesia Other Findings   Reproductive/Obstetrics                          Anesthesia Physical Anesthesia Plan  ASA: III  Anesthesia Plan: General   Post-op Pain Management:    Induction: Intravenous  Airway Management Planned: Oral ETT  Additional Equipment: None  Intra-op Plan:   Post-operative Plan: Extubation in OR  Informed Consent: I have reviewed the patients History and Physical, chart, labs and discussed the procedure including the risks, benefits and alternatives for the proposed anesthesia with the patient or authorized representative who has indicated his/her understanding and acceptance.   Dental advisory given  Plan Discussed with: CRNA and Surgeon  Anesthesia Plan Comments:         Anesthesia Quick Evaluation

## 2013-12-26 NOTE — Transfer of Care (Signed)
Immediate Anesthesia Transfer of Care Note  Patient: [name]  Procedure(s) Performed: Procedure(s) with comments: POSTERIOR LUMBAR FUSION 1 LEVEL (Left) - Left sided lumbar 5-sacrum 1 transforaminal lumbar interbody fusion with instrumentation, allograft  Patient Location: PACU  Anesthesia Type:General  Level of Consciousness: awake, oriented and patient cooperative  Airway & Oxygen Therapy: Patient Spontanous Breathing and Patient connected to nasal cannula oxygen  Post-op Assessment: Report given to PACU RN and Post -op Vital signs reviewed and stable  Post vital signs: Reviewed  Complications: No apparent anesthesia complications

## 2013-12-27 ENCOUNTER — Encounter (HOSPITAL_COMMUNITY): Payer: Self-pay | Admitting: Orthopedic Surgery

## 2013-12-27 NOTE — Progress Notes (Signed)
Occupational Therapy Evaluation Patient Details Name: Sandy LemonsSherril Armstrong MRN: [id number] DOB: 07/29/1956 Today's Date: 12/27/2013    History of Present Illness 58 y.o. female s/p TLIF L5-S1. Pertinent history of HTN.   Clinical Impression   PTA pt lived alone and was independent with ADLs and functional mobility. Education and training provided for 3/3 back precautions and incorporating into ADLs. Pt would benefit from continued skilled OT to promote independence with ADLs and functional mobility including safe tub transfer, bed mobility, and LB ADLs with use of adaptive equipment.    Follow Up Recommendations  No OT follow up;Supervision/Assistance - 24 hour    Equipment Recommendations  3 in 1 bedside comode       Precautions / Restrictions Precautions Precautions: Back Precaution Booklet Issued: Yes (comment) Precaution Comments: Reviewed Restrictions Weight Bearing Restrictions: No      Mobility Bed Mobility Overal bed mobility: Needs Assistance Bed Mobility: Rolling;Sidelying to Sit Rolling: Min guard (VC's for technique for log roll) Sidelying to sit: Max assist (HOB flat, pt required assist at shoulders to sit up)   Sit to supine: Min guard   General bed mobility comments: Pt required max assist for sidelying>sit for first time. VC's for technique. Would benefit from further training.  Transfers Overall transfer level: Needs assistance Equipment used: 1 person hand held assist Transfers: Sit to/from Stand Sit to Stand: Mod assist         General transfer comment: Educated pt on scooting to EOB or chair for ease of sit>stand transfer. Pt would likely benefit from RW while inpatient for functional mobility.          ADL Overall ADL's : Needs assistance/impaired Eating/Feeding: Independent;Sitting   Grooming: Wash/dry hands;Min guard;Standing   Upper Body Bathing: Set up;Sitting   Lower Body Bathing: Moderate assistance;Sit to/from stand;Cueing for back  precautions   Upper Body Dressing : Set up;Sitting   Lower Body Dressing: Moderate assistance;Sit to/from stand;Cueing for back precautions   Toilet Transfer: Moderate assistance;Ambulation;BSC;Grab bars (one person hand held assist)   Toileting- Clothing Manipulation and Hygiene: Moderate assistance;Sit to/from stand;Cueing for back precautions       Functional mobility during ADLs: Minimal assistance (1 person hand held assist) General ADL Comments: Educated pt on safely reaching feet for LB ADLs. Pt performed toilet transfer and grooming at sink with reported relief in back pain.     Vision  Per pt report, no change from baseline.                      Praxis Praxis Praxis tested?: Within functional limits    Pertinent Vitals/Pain Pt c/o pain in back surgical site, no pain score provided. Pt repositioned in recliner with reported pain relief after standing. Pt declined ice pack.     Hand Dominance Right   Extremity/Trunk Assessment Upper Extremity Assessment Upper Extremity Assessment: Overall WFL for tasks assessed   Lower Extremity Assessment Lower Extremity Assessment: Defer to PT evaluation   Cervical / Trunk Assessment Cervical / Trunk Assessment: Normal   Communication Communication Communication: No difficulties   Cognition Arousal/Alertness: Awake/alert Behavior During Therapy: WFL for tasks assessed/performed Overall Cognitive Status: Within Functional Limits for tasks assessed                                Home Living Family/patient expects to be discharged to:: Private residence Living Arrangements: Alone Available Help at Discharge: Family;Available 24 hours/day (until Sunday)  Type of Home: Other(Comment) ([facility] (lives on first floor)) Home Access: Level entry     Home Layout: One level     Bathroom Shower/Tub: Tub/shower unit Shower/tub characteristics: Engineer, building servicesCurtain Bathroom Toilet: Standard     Home Equipment: None           Prior Functioning/Environment Level of Independence: Independent             OT Diagnosis: Generalized weakness;Acute pain   OT Problem List: Decreased strength;Decreased range of motion;Decreased activity tolerance;Impaired balance (sitting and/or standing);Decreased safety awareness;Decreased knowledge of use of DME or AE;Decreased knowledge of precautions;Pain   OT Treatment/Interventions: Self-care/ADL training;Therapeutic exercise;Energy conservation;DME and/or AE instruction;Therapeutic activities;Patient/family education;Balance training    OT Goals(Current goals can be found in the care plan section) Acute Rehab OT Goals Patient Stated Goal: Go home OT Goal Formulation: With patient Time For Goal Achievement: 01/03/14 Potential to Achieve Goals: Good ADL Goals Pt Will Perform Grooming: with modified independence;standing Pt Will Perform Lower Body Bathing: with modified independence;with adaptive equipment;sit to/from stand Pt Will Perform Lower Body Dressing: with modified independence;with adaptive equipment;sit to/from stand Pt Will Transfer to Toilet: with modified independence;ambulating;bedside commode Pt Will Perform Toileting - Clothing Manipulation and hygiene: with modified independence;with adaptive equipment;sit to/from stand Pt Will Perform Tub/Shower Transfer: Tub transfer;with min guard assist;ambulating;3 in 1;rolling walker Additional ADL Goal #1: Pt will perform bed mobility at supervision level with log roll technique to prepare for ADLs.  OT Frequency: Min 2X/week    End of Session Equipment Utilized During Treatment: Gait belt Nurse Communication: Other (comment) (Pt sitting EOB for RN to check back surgical site)  Activity Tolerance: Patient tolerated treatment well Patient left: in chair;with call bell/phone within reach;with family/visitor present   Time: 1000-1036 OT Time Calculation (min): 36 min Charges:  OT General Charges $OT Visit:  1 Procedure OT Evaluation $Initial OT Evaluation Tier I: 1 Procedure OT Treatments $Self Care/Home Management : 23-37 mins  Rae LipsLeeann M Mandolin Falwell [phone] 12/27/2013, 1:35 PM

## 2013-12-27 NOTE — Op Note (Signed)
[name]:  Armstrong, [name]           ACCOUNT NO.:  [id number]  MEDICAL RECORD NO.:  [id number]  LOCATION:  [id number]                        FACILITY:  [facility]  PHYSICIAN:  Estill BambergMark Earley Grobe, MD      DATE OF BIRTH:  1956-02-01  DATE OF PROCEDURE:  12/26/2013                              OPERATIVE REPORT   PREOPERATIVE DIAGNOSES: 1. L5-S1 degenerative disk disease. 2. Severe left-sided foraminal stenosis resulting in left L5     radiculopathy. 3. Left greater than right lumbar radiculopathy secondary to bilateral     L5-S1 neural foraminal stenosis.  POSTOPERATIVE DIAGNOSES: 1. L5-S1 degenerative disk disease. 2. Severe left-sided foraminal stenosis resulting in left L5     radiculopathy. 3. Left greater than right lumbar radiculopathy secondary to bilateral     L5-S1 neural foraminal stenosis.  PROCEDURE: 1. Left-sided L5-S1 transforaminal lumbar interbody fusion. 2. Right-sided L5-S1 posterolateral fusion. 3. L5-S1 decompression requiring more bone removal then that required     for the interbody portion of the procedure. 4. Insertion of interbody device x1 (7 x 27 mm Concorde bullet cage.) 5. Placement of posterior instrumentation L5, S1. 6. Use of local autograft. 7. Use of morselized allograft. 8. Intraoperative use of fluoroscopy.  SURGEON:  Estill BambergMark Andie Mortimer, MD  ASSISTANT:  Sandy ModeKayla Mackenzie, PA-C.  ANESTHESIA:  General endotracheal anesthesia.  COMPLICATIONS:  None.  DISPOSITION:  Stable.  ESTIMATED BLOOD LOSS:  150 mL.  INDICATIONS FOR PROCEDURE:  Briefly, [name] is a very pleasant 58- year-old female who did present to me with a longstanding history of low back pain, as well as a 3-year history of left greater than right leg pain.  The patient's MRI did reveal bilateral neural foraminal stenosis at the L5-S1 region.  The stenosis did extend into the extraforaminal region as well on the left greater than the right sides.  The patient did fail multiple forms of  conservative care, including multiple epidural injections, as well as therapy and she did continue to have pain.  We, therefore, did discuss proceeding with the procedure reflected above.  OPERATIVE DETAILS:  On December 26, 2013, the patient was brought to surgery and general endotracheal anesthesia was administered.  The patient was placed prone on a well-padded flat Jackson bed with a spinal frame.  Antibiotics were given and a time-out procedure was performed. The back was prepped and draped in the usual sterile fashion and made a midline incision overlying the L5-S1 level.  The lamina of L5 and S1 were identified and subperiosteally exposed.  I did also subperiosteally exposed the transverse process of L5 and the sacral ala of S1 on the right side.  Using anatomical landmarks, in intraoperative fluoroscopy, I did cannulate the L5 and S1 pedicles.  I did use a 6 mm tap and I did use a ball-tip probe to confirm that there was no cortical violation of the cannulated pedicles.  A high-speed bur was used to decorticate the posterior elements and the posterolateral gutter on the right side.  A 7 x 40 mm screw was placed at S1 and a 7 x 45 mm screw was placed at L5. The appropriate length rod was secured into the screw heads and distraction was applied across the  rod.  Caps were placed and provisionally tightened.  I then turned my attention towards the patient's left side.  A full facetectomy was performed on the left.  I did extend the decompression out to the extraforaminal area.  I did also perform a lateral recess decompression on the right.  Of note, the decompressive aspect of the procedure did require more bone removal then that which was required for the interbody portion of the procedure.  I then turned my attention towards the posterolateral aspect of the L5-S1 intervertebral space.  It was noted that there was substantial high loss identified.  At this point, I did again cannulate  the L5 and S1 pedicles in the manner previously described.  Again, pedicle screws were placed at L5 and S1.  Distraction was again applied and a rod was placed and provisionally tightened with caps.  I then performed an annulotomy  with an assistant holding medial retraction of the traversing S1 nerve on the left.  I then proceeded with a full and complete diskectomy.  I was very pleased with the diskectomy that I was able to accomplish.  The endplates were appropriately prepared.  I then placed a series of interbody spacers, and I did feel that a 7 x 27 mm spacer would be the most appropriate fit.  The interbody space was then packed with autograft obtained from the decompression and with allograft in the form of DBX mix.  The interbody spacer was also packed with the autograft, allograft mixture and tamped into position in the usual fashion.  I was very pleased with the press fit of the implant, and I was very pleased with the AP and lateral fluoroscopic images.  I then released the distraction on the right and on the left sides.  The caps were then provisionally tightened and a final locking procedure was performed bilaterally.  I then packed the autograft and allograft mixture on the right side into the posterolateral gutter and along the posterior elements.  I was very pleased with the final appearance of the radiographs.  I then controlled minor areas of epidural bleeding using Surgiflo.  Of note, the wound was copiously irrigated throughout the surgery with a total of approximately 2 L of normal saline.  The fascia was then closed using #1 Vicryl.  The subcutaneous layer was then closed using 2-0 Vicryl.  The skin was closed using 3-0 Monocryl.  Benzoin and Steri-Strips were applied followed by sterile dressing.  All instrument counts were correct at the termination of the procedure.  Of note, I did use neurologic monitoring throughout the surgery, and there was no sustained  abnormal EMG activity noted throughout the surgery. Of note, [name] was my assistant throughout the surgery, and did aid in retraction, suctioning and closure.     Estill BambergMark Shaylea Ucci, MD     MD/MEDQ  D:  12/26/2013  T:  12/27/2013  Job:  [id number]  cc:   Elisha Ponderichard Marx David Spivey, MD

## 2013-12-27 NOTE — Progress Notes (Signed)
PO day 1, pt reports doing very well, slept well. Denies any leg pain, very well controlled LBP with only a "stiffness and mild pain" reported. Denies N/V/SOB  BP 112/67  Pulse 94  Temp(Src) 100.4 F (38 C) (Oral)  Resp 15  Ht 5\' 7"  (1.702 m)  Wt 94.802 kg (209 lb)  BMI 32.73 kg/m2  SpO2 98%  Pt laying comfortably in hospital bed, SCD's in place, 2+DPP, NVI, dressing CDI  PO Day 1 S/P L5-S1 Decompression and Fusion  -Resolved BIL leg pain   -Well controlled PO LBP   -D/C PCA   -Transition to oral meds, percocet and valium  -Up with PT/OT today    -Back precautions at all times   -D/C home likely sat vs Sunday pending progress in PT   -D/C orders printed and in chart    -Scripts signed and in chart, Percocet and Valium  -F/U in office 2 weeks

## 2013-12-27 NOTE — Plan of Care (Signed)
Problem: Consults Goal: Diagnosis - Spinal Surgery Outcome: Completed/Met Date Met:  12/27/13 Thoraco/Lumbar Spine Fusion

## 2013-12-27 NOTE — Evaluation (Signed)
Physical Therapy Evaluation Patient Details Name: Sandy LemonsSherril Crall MRN: [id number] DOB: 05/05/1956 Today's Date: 12/27/2013   History of Present Illness  [age] y.o. female s/p TLIF L5-S1. Pertinent history of HTN.  Clinical Impression  Patient is seen following procedure listed above for functional limitations due to the deficits listed below (see PT Problem List). Ambulating well, up to 100 feet with supervision for safety. Patient will benefit from skilled PT to increase their independence and safety with mobility to allow discharge to the venue listed below. Pt with some difficulty with initial transfer however progressed quickly with PT. She will only have 24hr care from her daughter until Sunday and this will be acceptable if she continues to progress well tomorrow with therapy.     Follow Up Recommendations No PT follow up;Supervision/Assistance - 24 hour    Equipment Recommendations  Rolling walker with 5" wheels;3in1 (PT)    Recommendations for Other Services OT consult     Precautions / Restrictions Precautions Precautions: Back Precaution Booklet Issued: Yes (comment) Precaution Comments: Reviewed Restrictions Weight Bearing Restrictions: No      Mobility  Bed Mobility Overal bed mobility: Needs Assistance Bed Mobility: Sit to Supine       Sit to supine: Min guard   General bed mobility comments: Min guard for safety with max verbal cues for technique with sit to sidelying and log roll to supine. Pt able to safely perform without breaking back precautions. Requires extra time and frequent cues for direction.  Transfers Overall transfer level: Needs assistance Equipment used: Rolling walker (2 wheeled) Transfers: Sit to/from Stand Sit to Stand: Min assist         General transfer comment: Min assist with sit<>stand from recliner. First attempt only able to partially stand and needed to sit again but this enabled her to scoot closer to edge of chair which she was  having difficulty with initially. Second attempt improved greatly with min guard for safety. Verbal cues for hand placement and to remind her of back precautions as she began to slightly rotate trunk when reaching for RW.   Ambulation/Gait Ambulation/Gait assistance: Supervision Ambulation Distance (Feet): 100 Feet Assistive device: Rolling walker (2 wheeled) Gait Pattern/deviations: Step-through pattern;Decreased stride length Gait velocity: decreased   General Gait Details: Pt slow and guarded with ambulation, even slower with turns however maintains back precautions and is doing so safely. Cues for increased step length and forward gaze. Demonstrates good control of rolling walker.  Stairs            Wheelchair Mobility    Modified Rankin (Stroke Patients Only)       Balance Overall balance assessment: Modified Independent                                           Pertinent Vitals/Pain 5/10 pain Pt repositioned in bed for comfort.    Home Living Family/patient expects to be discharged to:: Private residence Living Arrangements: Alone Available Help at Discharge: Family;Available 24 hours/day (until sunday) Type of Home: Apartment Home Access: Level entry     Home Layout: One level Home Equipment: None      Prior Function Level of Independence: Independent               Hand Dominance   Dominant Hand: Right    Extremity/Trunk Assessment   Upper Extremity Assessment: Defer to OT evaluation  Lower Extremity Assessment: Generalized weakness         Communication   Communication: No difficulties  Cognition Arousal/Alertness: Awake/alert Behavior During Therapy: WFL for tasks assessed/performed Overall Cognitive Status: Within Functional Limits for tasks assessed                      General Comments General comments (skin integrity, edema, etc.): Reviewed back precautions. Pt able to state 3/3 this AM  after her OT session. Needs occasional reminder to maintain precautions with transfers. Discussed pt concerns with moblity when she d/c to home with daughter, states she cannot think of any further obstacles that were not covered during todays therapy session. Has no further questions at this time    Exercises General Exercises - Lower Extremity Ankle Circles/Pumps: AROM;Both;10 reps;Supine      Assessment/Plan    PT Assessment Patient needs continued PT services  PT Diagnosis Difficulty walking;Abnormality of gait;Acute pain;Generalized weakness   PT Problem List Decreased strength;Decreased range of motion;Decreased activity tolerance;Decreased mobility;Decreased knowledge of use of DME;Decreased balance;Decreased knowledge of precautions;Pain  PT Treatment Interventions DME instruction;Gait training;Functional mobility training;Therapeutic activities;Therapeutic exercise;Balance training;Neuromuscular re-education;Patient/family education;Modalities   PT Goals (Current goals can be found in the Care Plan section) Acute Rehab PT Goals Patient Stated Goal: Go home PT Goal Formulation: With patient Time For Goal Achievement: 01/03/14 Potential to Achieve Goals: Good    Frequency Min 5X/week   Barriers to discharge Decreased caregiver support Daughter only available 24 hr/day until sunday    Co-evaluation               End of Session Equipment Utilized During Treatment: Gait belt Activity Tolerance: Patient tolerated treatment well Patient left: in bed;with call bell/phone within reach;with family/visitor present Nurse Communication: Mobility status         Time: 1122-1155 PT Time Calculation (min): 33 min   Charges:   PT Evaluation $Initial PT Evaluation Tier I: 1 Procedure PT Treatments $Gait Training: 8-22 mins $Self Care/Home Management: 8-22   PT G Codes:         Charlsie MerlesLogan Secor Violet Se[place]ury, [place] CarolinaPT [phone]  Berton MountLogan S Glenda Spelman 12/27/2013, 1:01 PM

## 2013-12-28 MED ORDER — ONDANSETRON HCL 4 MG PO TABS: 4.0000 mg | ORAL_TABLET | Freq: Three times a day (TID) | ORAL | Status: AC | PRN

## 2013-12-28 NOTE — Progress Notes (Signed)
PATIENT ID: [name]   2 Days Post-Op Procedure(s) (LRB): POSTERIOR LUMBAR FUSION 1 LEVEL (Left)  Subjective: Reports doing well this am. Some LBP, controlled with pain medicine, denies leg pain, numbness, tingling. Feels like she is ready to go home today. No other complaints or concerns.   Objective:  Filed Vitals:   12/28/13 0445  BP: 116/65  Pulse: 90  Temp: 99 F (37.2 C)  Resp: 18     Lying comfortably in hospital bed SCDs in place Wiggles toes, Distally NVI Dressing c/d/i  Labs:  No results found for this basename: HGB,  in the last 72 hoursNo results found for this basename: WBC, RBC, HCT, PLT,  in the last 72 hoursNo results found for this basename: NA, K, CL, CO2, BUN, CREATININE, GLUCOSE, CALCIUM,  in the last 72 hours  Assessment and Plan: PO Day 2 S/P L5-S1 Decompression and Fusion  Pain controlled and appropriate, doing well, continue oral meds Up with PT/OT today, back precautions at all times D/c home today if cleared by PT D/c order completed in chart Scripts signed and in chart F/U in office 2 weeks

## 2013-12-28 NOTE — Progress Notes (Signed)
Physical Therapy Treatment Patient Details Name: Sandy LemonsSherril Armstrong MRN: [id number] DOB: 09/24/1955 Today's Date: 12/28/2013    History of Present Illness 58 y.o. female s/p TLIF L5-S1. Pertinent history of HTN.    PT Comments    Pt continues to need up to mod-max assist with bed mobility and transfers. Lives alone and daughter is returning to college for exams/graduation and will not be available past tomorrow to assist pt at home. Due to limited caregiver support and increased need of assistance with mobility PT now recommending pt go to ST-SNF before discharge home alone. Pt and family in agreement with new plan for post acute follow up.   Follow Up Recommendations  SNF;Supervision/Assistance - 24 hour     Equipment Recommendations  Rolling walker with 5" wheels;3in1 (PT)    Recommendations for Other Services OT consult     Precautions / Restrictions Precautions Precautions: Back Precaution Booklet Issued: Yes (comment) Precaution Comments: pt able to recall 2/3 back precautions, re-educated on all 3. Restrictions Weight Bearing Restrictions: No    Mobility  Bed Mobility Overal bed mobility: Needs Assistance Bed Mobility: Rolling;Sidelying to Sit;Sit to Sidelying Rolling: Min assist (used hand rail, assistance given at shoulder and hip ) Sidelying to sit: Max assist     Sit to sidelying: Mod assist General bed mobility comments: bed flat and no rails used. needed max assist to transition trunk from sidelying to sitting edge of bed and min assist to advance legs fully off edge of bed prior to sitting up. mod multimodal cues for technique to adhere to back precautions.     Transfers Overall transfer level: Needs assistance Equipment used: Rolling walker (2 wheeled) Transfers: Sit to/from Stand Sit to Stand: Mod assist;Min assist Stand pivot transfers: Min guard       General transfer comment: cues on hand placment with standing from bed and to sit to recliner/chair.  mod assist with cues for ant weight shift to power up to standing. min assist to control descent with sitting.  Ambulation/Gait Ambulation/Gait assistance: Min guard Ambulation Distance (Feet): 60 Feet Assistive device: Rolling walker (2 wheeled) Gait Pattern/deviations: Step-through pattern;Decreased stride length Gait velocity: decreased Gait velocity interpretation: Below normal speed for age/gender General Gait Details: cues on walker position and to incr step length with gait. Pt. with slow and guarded gait. Demo'd good posture and adherance to back precautions with gait.         Cognition Arousal/Alertness: Awake/alert Behavior During Therapy: WFL for tasks assessed/performed Overall Cognitive Status: Within Functional Limits for tasks assessed                PT Goals (current goals can now be found in the care plan section) Acute Rehab PT Goals Patient Stated Goal: not stated.  PT Goal Formulation: With patient Time For Goal Achievement: 01/03/14 Potential to Achieve Goals: Good Progress towards PT goals: Progressing toward goals    Frequency  Min 5X/week    PT Plan Discharge plan needs to be updated    End of Session Equipment Utilized During Treatment: Gait belt Activity Tolerance: Patient tolerated treatment well;Patient limited by pain;Patient limited by fatigue Patient left: in chair;with call bell/phone within reach;with family/visitor present     Time: 1116-1140 PT Time Calculation (min): 24 min  Charges:  $Gait Training: 8-22 mins $Therapeutic Activity: 8-22 mins                    G Codes:      Sandy KusterKathy Jakara Armstrong 12/28/2013, 12:05  PM   

## 2013-12-28 NOTE — Progress Notes (Signed)
Clinical Social Work Department BRIEF PSYCHOSOCIAL ASSESSMENT 12/28/2013  Patient:  [name], [name]     Account Number:  [id number]     Admit date:  12/26/2013  Clinical Social Worker:  Rolinda Roan  Date/Time:  12/28/2013 06:47 PM  Referred by:  Physician  Date Referred:  12/28/2013 Referred for  SNF Placement   Other Referral:   Interview type:  Patient Other interview type:    PSYCHOSOCIAL DATA Living Status:  ALONE Admitted from facility:   Level of care:   Primary support name:  Jeanine Luz [phone] Primary support relationship to patient:  CHILD, ADULT Degree of support available:   Good support at bedside.    CURRENT CONCERNS  Other Concerns:    SOCIAL WORK ASSESSMENT / PLAN Clinical Social Worker (CSW) met with patient to discuss SNF placement. Patient reported that she lives alone and has nobody to provide 24 hour care at home. Patient is agreeable to SNF search in [facility].    CSW explained to patient that her insurance [employer] might not have SNF benefits and certain facilities will not contract with [employer]. Patient verbalized her understanding and is prepared to go home with home health if she does not have SNF benefits.   Assessment/plan status:  Psychosocial Support/Ongoing Assessment of Needs Other assessment/ plan:   CSW gave patient SNF list.   Information/referral to community resources:    PATIENT'S/FAMILY'S RESPONSE TO PLAN OF CARE: Patient thanked CSW for visit and starting placement process.

## 2013-12-28 NOTE — Progress Notes (Addendum)
Clinical Social Work Department CLINICAL SOCIAL WORK PLACEMENT NOTE 12/28/2013  Patient:  Sandy LemonsCOVINGTON,Glendy  Account Number:  [id number] Admit date:  12/26/2013  Clinical Social Worker:  Jetta LoutBAILEY MORGAN, Theresia MajorsLCSWA  Date/time:  12/28/2013 06:52 PM  Clinical Social Work is seeking post-discharge placement for this patient at the following level of care:   SKILLED NURSING   (*CSW will update this form in Epic as items are completed)   12/28/2013  [facility]/family provided with [facility] of Clinical [facility] Work's list of facilities offering this level of care within the geographic area requested by the patient (or if unable, by the patient's family).  12/28/2013  Patient/family informed of their freedom to choose among providers that offer the needed level of care, that participate in Medicare, Medicaid or managed care program needed by the patient, have an available bed and are willing to accept the patient.  12/28/2013  Patient/family informed of [facility]' ownership interest in [facility], as well as of the fact that they are under no obligation to receive care at this facility.  PASARR submitted to EDS on 12/29/13 PASARR number received from [employer] on 12/29/13  FL2 transmitted to all facilities in geographic area requested by pt/family on  12/28/2013 FL2 transmitted to all facilities within larger geographic area on   Patient informed that his/her managed care company has contracts with or will negotiate with  certain facilities, including the following:     Patient/family informed of bed offers received: 12/30/13  Patient chooses bed at  Physician recommends and patient chooses bed at    Patient to be transferred to  on   Patient to be transferred to facility by   The following physician request were entered in Epic:   Additional Comments: CSW could not submit PASARR. Leroy Must was down on Saturday 12/28/13.

## 2013-12-28 NOTE — Progress Notes (Signed)
Occupational Therapy Treatment Patient Details Name: Sandy LemonsSherril Armstrong MRN: [id number] DOB: 06/27/1956 Today's Date: 12/28/2013    History of present illness 58 y.o. female s/p TLIF L5-S1. Pertinent history of HTN.   OT comments  Pt continues to need min-max A and use of bedrails to perform bed mobility and transfer tasks. Pt able to state 1/3 back precautions. Updating d/c recommendation to SNF due to pt need for 24 hour assistance with no available help after tomorrow [name](Sun, May 3). Discussed my recommendation with pt and daughter with both indicating agreement.   Follow Up Recommendations  SNF;Supervision/Assistance - 24 hour    Equipment Recommendations  3 in 1 bedside comode;Other (comment) (AE for LB ADLs )    Recommendations for Other Services      Precautions / Restrictions Precautions Precautions: Back Precaution Booklet Issued: Yes (comment) Precaution Comments: Reviewed Restrictions Weight Bearing Restrictions: No       Mobility Bed Mobility Overal bed mobility: Needs Assistance Bed Mobility: Rolling;Sidelying to Sit;Sit to Sidelying Rolling: Min assist (used hand rail, assistance given at shoulder and hip ) Sidelying to sit: Max assist (HOB flat)     Sit to sidelying: Mod assist General bed mobility comments: Pt performed bed mobility tasks with mod-max A. Cues needed for technique.  Transfers Overall transfer level: Needs assistance Equipment used: Rolling walker (2 wheeled) Transfers: Sit to/from [employer]GI CorporationStand;Stand Pivot Transfers Sit to Stand: Mod assist (stabilizing assistance with pt leaning to left) Stand pivot transfers: Min guard       General transfer comment: Mod A sit>stand. cues for technique; unsteady upon initial standing    Balance Overall balance assessment: Needs assistance Sitting-balance support: Feet supported Sitting balance-[name]: Fair     Standing balance support: Bilateral upper extremity supported;During functional  activity Standing balance-[name]: Poor Standing balance comment: Pt needed assistance to normalize posture during sit>stand with pt leaning to left                   ADL Overall ADL's : Needs assistance/impaired             Lower Body Bathing: Moderate assistance;Sit to/from stand;Cueing for back precautions   Upper Body Dressing : Set up;Sitting   Lower Body Dressing: Moderate assistance;Sit to/from stand;Cueing for back precautions   Toilet Transfer: BSC;RW;Minimal assistance;Cueing for safety;Ambulation   Toileting- Clothing Manipulation and Hygiene: Moderate assistance;Cueing for back precautions;Sit to/from stand   Tub/ Shower Transfer: Minimal assistance;Cueing for safety;Ambulation;3 in 1;Rolling walker   Functional mobility during ADLs: Min guard;Rolling walker;Cueing for safety General ADL Comments: Pt continues to need some assistance with LB ADLs and transfers. Education given on LB ADL AE and techniques. Pt able to state 1/3 precautions. Pt needing min A for LB clothing manipulation tasks in sit-stand. Educated on safe completion of LB ADLs with precautions. Pt needing mod vc on transfer technique and hand placement transfers with rw.       Vision                     Perception     Praxis      Cognition   Behavior During Therapy: [name] for tasks assessed/performed Overall Cognitive Status: Within Functional Limits for tasks assessed                       Extremity/Trunk Assessment               Exercises     Shoulder Instructions  General Comments      Pertinent Vitals/ Pain       5/10 pain. Increased activity during session.  Home Living                                          Prior Functioning/Environment              Frequency Min 2X/week     Progress Toward Goals  OT Goals(current goals can now be found in the care plan section)  Progress towards OT goals: Progressing toward  goals  Acute Rehab OT Goals Patient Stated Goal: not stated.  OT Goal Formulation: With patient/family Time For Goal Achievement: 01/03/14 Potential to Achieve Goals: Good ADL Goals Pt Will Perform Grooming: with modified independence;standing Pt Will Perform Lower Body Bathing: with modified independence;with adaptive equipment;sit to/from stand Pt Will Perform Lower Body Dressing: with modified independence;with adaptive equipment;sit to/from stand Pt Will Transfer to Toilet: with modified independence;ambulating;bedside commode Pt Will Perform Toileting - Clothing Manipulation and hygiene: with modified independence;with adaptive equipment;sit to/from stand Pt Will Perform Tub/Shower Transfer: Tub transfer;with min guard assist;ambulating;3 in 1;rolling walker Additional ADL Goal #1: Pt will perform bed mobility at supervision level with log roll technique to prepare for ADLs.  Plan Discharge plan needs to be updated    Co-evaluation                 End of Session Equipment Utilized During Treatment: Gait belt;Rolling walker   Activity Tolerance Patient tolerated treatment well   Patient Left in bed;with call bell/phone within reach;with family/visitor present   Nurse Communication Other (comment);Mobility status (level of assist needed during session; d/c planning)        Time: [id number] OT Time Calculation (min): 33 min  Charges: OT General Charges $OT Visit: 1 Procedure OT Treatments $Self Care/Home Management : 23-37 mins  [name] 12/28/2013, 10:26 AM

## 2013-12-28 NOTE — Progress Notes (Signed)
Discussed with Olegario MessierKathy and agree with change in d/c plan. 12/28/2013 Corlis HoveMargie Diona Peregoy, PT [phone]

## 2013-12-29 MED ORDER — OXYCODONE-ACETAMINOPHEN 5-325 MG PO TABS
2.0000 | ORAL_TABLET | ORAL | Status: DC | PRN
  Administered 2013-12-31 (×2): 2 via ORAL
  Filled 2013-12-29 (×3): qty 2

## 2013-12-29 NOTE — Progress Notes (Signed)
   PATIENT ID: [name]   3 Days Post-Op Procedure(s) (LRB): POSTERIOR LUMBAR FUSION 1 LEVEL (Left)  Subjective: Patient reports doing much better this am, getting better every day. Has some difficulty with nausea when taking pain rx. Okay with d/c to SNF as she does not have 24 hr care at home.   Objective:  Filed Vitals:   12/29/13 0535  BP: 110/65  Pulse: 66  Temp: 98.1 F (36.7 C)  Resp: 16     Lying comfortably in hospital bed  SCDs in place  Wiggles toes, Distally NVI  Dressing c/d/i  Labs:  No results found for this basename: HGB,  in the last 72 hoursNo results found for this basename: WBC, RBC, HCT, PLT,  in the last 72 hoursNo results found for this basename: NA, K, CL, CO2, BUN, CREATININE, GLUCOSE, CALCIUM,  in the last 72 hours  Assessment and Plan: PO Day 3 S/P L5-S1 Decompression and Fusion  Pain controlled, discussed with nurse to ween off oxycontin ER and changed oxycodone to Percocet to see if the added tylenol gives better pain relief Up with PT/OT today, back precautions at all times  Per PT/OT, recommend SNF as patient has difficulty getting out of bed and does not have 24 hr care at home. Pending SNF placement. D/c order completed in chart  Scripts signed and in chart, added Zofran script for nausea  F/U in office 2 weeks

## 2013-12-30 NOTE — Progress Notes (Signed)
Pt PO Day 4, reports pain well controlled, progressing well with exception of difficulty getting up out of bed. No longer has help at home as daughter had to return to school for exams. Awaiting SNF placement. Denies N/V/SOB/ pt with NL B&B function  BP 114/64  Pulse 67  Temp(Src) 100.1 F (37.8 C) (Oral)  Resp 18  Ht 5\' 7"  (1.702 m)  Wt 94.802 kg (209 lb)  BMI 32.73 kg/m2  SpO2 100%  Pt sitting up in hospital bed resting comfortably eating breakfast. SCD's in place, Dressing CDI, NVI  Assessment and Plan:  PO Day 4 S/P L5-S1 Decompression and Fusion  Pain controlled cont current medications Cont PT/OT today, back precautions at all times  Per PT/OT, recommend SNF as patient has difficulty getting out of bed and does not have 24 hr care at home. Pending SNF placement.  D/c order completed in chart  Scripts signed and in chart, added Zofran script for nausea  Bulky external dressing can be removed PO day 5 and pt can shower normally over steri strips F/U in office 2 weeks

## 2013-12-30 NOTE — Progress Notes (Signed)
Physical Therapy Treatment Patient Details Name: Sandy LemonsSherril Armstrong MRN: [id number] DOB: 01/01/1956 Today's Date: 12/30/2013    History of Present Illness 58 y.o. female s/p TLIF L5-S1. Pertinent history of HTN.    PT Comments    Patient does not have assistance during the day at home therefore continue to recommend SNF. Patient was able to progress with ambulation this session. Continues to need reinforcement of back precautions with mobility.   Follow Up Recommendations  SNF;Supervision/Assistance - 24 hour     Equipment Recommendations  Rolling walker with 5" wheels;3in1 (PT)    Recommendations for Other Services       Precautions / Restrictions Precautions Precautions: Back Precaution Comments: Paitent able to recall no bending no twisting. Cues for no arching    Mobility  Bed Mobility               General bed mobility comments: Patient had just gotten up with nursing staff to recliner  Transfers Overall transfer level: Needs assistance Equipment used: Rolling walker (2 wheeled)             General transfer comment: Cues for hand placement and not to lean too far forward with stand. A for powering up out of recliner  Ambulation/Gait Ambulation/Gait assistance: Min guard Ambulation Distance (Feet): 100 Feet Assistive device: Rolling walker (2 wheeled) Gait Pattern/deviations: Step-through pattern;Decreased stride length Gait velocity: decreased   General Gait Details: Patient is guarded and cautious with gait. Cues for upright posture. Adjust height of RW so patient did not lean over top of Rw.    Stairs            Wheelchair Mobility    Modified Rankin (Stroke Patients Only)       Balance                                    Cognition Arousal/Alertness: Awake/alert Behavior During Therapy: WFL for tasks assessed/performed Overall Cognitive Status: Within Functional Limits for tasks assessed                       Exercises      General Comments        Pertinent Vitals/Pain no apparent distress     Home Living                      Prior Function            PT Goals (current goals can now be found in the care plan section) Progress towards PT goals: Progressing toward goals    Frequency  Min 5X/week    PT Plan Current plan remains appropriate    Co-evaluation             End of Session Equipment Utilized During Treatment: Gait belt Activity Tolerance: Patient tolerated treatment well Patient left: in chair;with call bell/phone within reach     Time: 1027-1045 PT Time Calculation (min): 18 min  Charges:  $Gait Training: 8-22 mins                    G Codes:      Sandy PotterJulia Elizabeth Laxmi Armstrong 12/30/2013, 2:16 PM 12/30/2013 Sandy PotterJulia Elizabeth Gila Armstrong PTA [phone] pager [phone] office

## 2013-12-30 NOTE — Progress Notes (Signed)
Pt provided with bed offers.

## 2013-12-30 NOTE — Discharge Summary (Signed)
Patient ID: [name] MRN: [id number] DOB/AGE: September 20, 1955 58 y.o.  Admit date: 12/26/2013 Discharge date: 12/30/2013  Admission Diagnoses:  Active Problems:   Radiculopathy   Discharge Diagnoses:  Same  Past Medical History  Diagnosis Date  . Hypertension   . Arthritis   . Hepatitis   . HIV (human immunodeficiency virus infection)     Surgeries: Procedure(s): POSTERIOR LUMBAR FUSION 1 LEVEL L5-S1 on 12/26/2013   Consultants: Social Work for SNF  Discharged Condition: Improved  Hospital Course: [name] is an 58 y.o. female who was admitted 12/26/2013 for operative treatment of radiculopathy. Patient has severe unremitting pain that affects sleep, daily activities, and work/hobbies. After pre-op clearance the patient was taken to the operating room on 12/26/2013 and underwent  Procedure(s): POSTERIOR LUMBAR FUSION 1 LEVEL L5-S1.    Patient was given perioperative antibiotics: Anti-infectives   Start     Dose/Rate Route Frequency Ordered Stop   12/26/13 2200  efavirenz-emtricitabine-tenofovir (ATRIPLA) 600-200-300 MG per tablet 1 tablet     1 tablet Oral Daily at bedtime 12/26/13 1459     12/26/13 1730  ceFAZolin (ANCEF) IVPB 1 g/50 mL premix     1 g 100 mL/hr over 30 Minutes Intravenous Every 8 hours 12/26/13 1459 12/27/13 0224   12/26/13 0600  ceFAZolin (ANCEF) IVPB 2 g/50 mL premix     2 g 100 mL/hr over 30 Minutes Intravenous On call to O.R. 12/25/13 1421 12/26/13 1132       Patient was given sequential compression devices, early ambulation to prevent DVT.  Patient benefited maximally from hospital stay and there were no complications.    Recent vital signs: Patient Vitals for the past 24 hrs:  BP Temp Temp src Pulse Resp SpO2  12/30/13 0513 114/64 mmHg 100.1 F (37.8 C) Oral 67 18 100 %  12/29/13 2132 98/62 mmHg 99 F (37.2 C) Oral 74 18 99 %  12/29/13 1341 91/59 mmHg 98.7 F (37.1 C) - 70 18 100 %     Discharge Medications:     Medication  List         amLODipine 5 MG tablet  Commonly known as:  NORVASC  Take 5 mg by mouth every morning.     azaTHIOprine 50 MG tablet  Commonly known as:  IMURAN  Take 50 mg by mouth 2 (two) times daily.     benazepril-hydrochlorthiazide 20-12.5 MG per tablet  Commonly known as:  LOTENSIN HCT  Take 2 tablets by mouth daily with breakfast.     COMBIGAN OP  Place 1 drop into both eyes 2 (two) times daily.     efavirenz-emtricitabine-tenofovir 600-200-300 MG per tablet  Commonly known as:  ATRIPLA  Take 1 tablet by mouth at bedtime.     estrogens (conjugated) 0.625 MG tablet  Commonly known as:  PREMARIN  Take 0.625 mg by mouth daily. Take daily for 21 days then do not take for 7 days.     fexofenadine 180 MG tablet  Commonly known as:  ALLEGRA  Take 180 mg by mouth daily as needed for allergies or rhinitis.     fluticasone 50 MCG/ACT nasal spray  Commonly known as:  FLONASE  Place 2 sprays into both nostrils daily as needed for allergies or rhinitis.     NUCYNTA 100 MG Tabs  Generic drug:  Tapentadol HCl  Take 1 tablet by mouth every 12 (twelve) hours.     NUCYNTA ER 100 MG Tb12  Generic drug:  Tapentadol HCl  Take 50-100  mg by mouth every 8 (eight) hours.     ondansetron 4 MG tablet  Commonly known as:  ZOFRAN  Take 1 tablet (4 mg total) by mouth every 8 (eight) hours as needed for nausea or vomiting.     predniSONE 2.5 MG tablet  Commonly known as:  DELTASONE  Take 2.5 mg by mouth every morning.     risedronate 35 MG tablet  Commonly known as:  ACTONEL  Take 35 mg by mouth every 7 (seven) days. with water on empty stomach, nothing by mouth or lie down for next 30 minutes.     VITAMIN C PO  Take 1 tablet by mouth daily.        Diagnostic Studies: Dg Chest 2 View  12/20/2013   CLINICAL DATA:  Preoperative chest x-ray  EXAM: CHEST  2 VIEW  COMPARISON:  Prior chest x-ray 01/23/2010  FINDINGS: The lungs are clear and negative for focal airspace consolidation,  pulmonary edema or suspicious pulmonary nodule. No pleural effusion or pneumothorax. Cardiac and mediastinal contours are within normal limits consistent with interval resolution of mild cardiomegaly compared to prior. No acute fracture or lytic or blastic osseous lesions. The visualized upper abdominal bowel gas pattern is unremarkable.  IMPRESSION: No active cardiopulmonary disease.   Electronically Signed   By: Malachy MoanHeath  McCullough M.D.   On: 12/20/2013 16:40   Dg Lumbar Spine 2-3 Views  12/26/2013   CLINICAL DATA:  L5-S1 fusion for radiculopathy.  EXAM: DG C-ARM 61-120 MIN; LUMBAR SPINE - 2-3 VIEW  FLUOROSCOPY TIME:  33 seconds  COMPARISON:  MRI lumbar spine 11/30/2013  FINDINGS: Two spot fluoroscopic views, one in the AP projection and one in the lateral projection show posterior fusion of L5-S1. Normal alignment. No complicating feature identified.  IMPRESSION: Intraoperative views demonstrate posterior L5-S1 fusion.   Electronically Signed   By: Britta MccreedySusan  Turner M.D.   On: 12/26/2013 13:04   Dg Lumbar Spine 2-3 Views  12/26/2013   CLINICAL DATA:  L5-S1 fusion.  EXAM: LUMBAR SPINE - 2-3 VIEW  COMPARISON:  11/30/2013 MR.  FINDINGS: Three intraoperative lateral views of the lumbar spine submitted for review after surgery.  First film reveals metallic probe posterior to the L3 and L4 spinous process  Second film reveals metallic probe posterior to the L5-S1 level.  Third film reveals metallic probe posterior to the L5-S1 level with metallic rakes above and below this region.  IMPRESSION: Localization L5-S1   Electronically Signed   By: Bridgett LarssonSteve  Olson M.D.   On: 12/26/2013 11:21   Mr Lumbar Spine Wo Contrast  11/30/2013   CLINICAL DATA:  Low back pain and bilateral leg pain.  EXAM: MRI LUMBAR SPINE WITHOUT CONTRAST  TECHNIQUE: Multiplanar, multisequence MR imaging was performed. No intravenous contrast was administered.  COMPARISON:  None.  FINDINGS: Normal alignment of the lumbar vertebral bodies. They  demonstrate normal marrow signal except for mild endplate reactive changes at L5-S1. The last full intervertebral disc space is labeled L5-S1 and the conus medullaris terminates at T12-L1 the facets are normally aligned. No pars defects. No paraspinal or retroperitoneal process.  L1-2:  No significant findings.  L2-3:  No significant findings.  L3-4: Annular rent and small central disc protrusion with mild impression on the ventral thecal sac. There is mild lateral recess encroachment bilaterally. No significant spinal or foraminal stenosis. Mild facet disease.  L4-5: Annular rent but no disc protrusion. No spinal, lateral recess or foraminal stenosis.  L5-S1: Moderate degenerative disc disease with a bulging  annulus and osteophytic ridging. Minimal impression on the thecal sac. Mild foraminal encroachment bilaterally, left greater than right.  IMPRESSION: Annular rent and shallow disc protrusion at L3-4.  Annular rent and bulging annulus at L4-5.  Degenerative disease at L5-S1 with a bulging degenerated annulus and osteophytic ridging contributing to mild foraminal stenosis, left greater than right.   Electronically Signed   By: Loralie ChampagneMark  Gallerani M.D.   On: 11/30/2013 19:12   Dg C-arm 61-120 Min  12/26/2013   CLINICAL DATA:  L5-S1 fusion for radiculopathy.  EXAM: DG C-ARM 61-120 MIN; LUMBAR SPINE - 2-3 VIEW  FLUOROSCOPY TIME:  33 seconds  COMPARISON:  MRI lumbar spine 11/30/2013  FINDINGS: Two spot fluoroscopic views, one in the AP projection and one in the lateral projection show posterior fusion of L5-S1. Normal alignment. No complicating feature identified.  IMPRESSION: Intraoperative views demonstrate posterior L5-S1 fusion.   Electronically Signed   By: Britta MccreedySusan  Turner M.D.   On: 12/26/2013 13:04    Disposition:       Discharge Orders   Future Orders Complete By Expires   Call MD / Call 911  As directed    Constipation Prevention  As directed    Diet - low sodium heart healthy  As directed     Increase activity slowly as tolerated  As directed        PO Day 4 S/P L5-S1 Decompression and Fusion  Pain controlled cont current medications  Cont PT/OT today, back precautions at all times  Per PT/OT, recommend SNF as patient has difficulty getting out of bed and does not have 24 hr care at home. Pending SNF placement.  D/c order completed in chart  Scripts signed and in chart, added Zofran script for nausea  Bulky external dressing can be removed PO day 5 and pt can shower normally over steri strips  F/U in office 2 weeks   Signed: Georga BoraKayla J Cayden Rautio 12/30/2013, 8:37 AM

## 2013-12-30 NOTE — Care Management Note (Signed)
CARE MANAGEMENT NOTE 12/30/2013  Patient:  Sandy LemonsCOVINGTON,Laketra   Account Number:  [id number]  Date Initiated:  12/30/2013  Documentation initiated by:  Vance PeperBRADY,Esli Jernigan  Subjective/Objective Assessment:   58 yr old female s/p L5-S1 fusion     Action/Plan:   Patient requires shortterm rehab at SNF, [facility]. Case Manager will notify Child psychotherapistocial Worker.   Anticipated DC Date:  12/31/2013   Anticipated DC Plan:  SKILLED NURSING FACILITY  In-house referral  Clinical Social Worker      DC Planning Services  CM consult      Choice offered to / List presented to:             Status of service:  In process, will continue to follow Medicare Important Message given?   (If response is "NO", the following Medicare IM given date fields will be blank) Date Medicare IM given:   Date Additional Medicare IM given:    Discharge Disposition:    Per UR Regulation:    If discussed at Long Length of Stay Meetings, dates discussed:    Comments:  12/30/13 Vance PeperSusan Troye Hiemstra, RN BSN Case Manager Patient's [employer] Case Manager is: Tola: [phone], ext.[phone]

## 2013-12-31 MED ORDER — DOCUSATE SODIUM 100 MG PO CAPS
100.0000 mg | ORAL_CAPSULE | Freq: Two times a day (BID) | ORAL | Status: DC
  Administered 2013-12-31: 100 mg via ORAL

## 2013-12-31 MED ORDER — BISACODYL 5 MG PO TBEC
5.0000 mg | DELAYED_RELEASE_TABLET | Freq: Every day | ORAL | Status: DC | PRN
  Administered 2013-12-31: 5 mg via ORAL
  Filled 2013-12-31: qty 1

## 2013-12-31 NOTE — Progress Notes (Signed)
Physical Therapy Treatment Patient Details Name: Sandy LemonsSherril Newill MRN: [id number] DOB: 08/15/1956 Today's Date: 12/31/2013    History of Present Illness 58 y.o. female s/p TLIF L5-S1. Pertinent history of HTN.    PT Comments     Patient continues to make good progress. Will continue to recommend SNF for functional independence training prior to DC home alone  Follow Up Recommendations  SNF;Supervision/Assistance - 24 hour     Equipment Recommendations  Rolling walker with 5" wheels;3in1 (PT)    Recommendations for Other Services       Precautions / Restrictions Precautions Precautions: Back Precaution Comments: Patient able to recall all precautions    Mobility  Bed Mobility Overal bed mobility: Needs Assistance   Rolling: Min assist Sidelying to sit: Min assist       General bed mobility comments: Min A to come into sitting positioning and cues to maintain back precautions.   Transfers Overall transfer level: Needs assistance Equipment used: Rolling walker (2 wheeled) Transfers: Sit to/from Stand Sit to Stand: Min assist         General transfer comment: Cues for hand placement   Ambulation/Gait Ambulation/Gait assistance: Min guard Ambulation Distance (Feet): 120 Feet Assistive device: Rolling walker (2 wheeled) Gait Pattern/deviations: Step-through pattern;Decreased stride length Gait velocity: decreased   General Gait Details: Cues for upright posture and not to lean over Rw   Stairs            Wheelchair Mobility    Modified Rankin (Stroke Patients Only)       Balance                                    Cognition Arousal/Alertness: Awake/alert Behavior During Therapy: WFL for tasks assessed/performed Overall Cognitive Status: Within Functional Limits for tasks assessed                      Exercises      General Comments        Pertinent Vitals/Pain 3/10 back pain. patient repositioned for comfort      Home Living                      Prior Function            PT Goals (current goals can now be found in the care plan section) Progress towards PT goals: Progressing toward goals    Frequency  Min 5X/week    PT Plan Current plan remains appropriate    Co-evaluation             End of Session Equipment Utilized During Treatment: Gait belt Activity Tolerance: Patient tolerated treatment well Patient left: in chair;with call bell/phone within reach     Time: 1045-1110 PT Time Calculation (min): 25 min  Charges:  $Therapeutic Exercise: 8-22 mins $Therapeutic Activity: 8-22 mins                    G Codes:      Adline PotterJulia Elizabeth Robinette 12/31/2013, 11:44 AM 12/31/2013 Adline PotterJulia Elizabeth Robinette PTA [phone] pager [phone] office

## 2013-12-31 NOTE — Progress Notes (Signed)
Pt PO Day 5, reports pain well controlled, progressing well with exception of difficulty getting up out of bed. No longer has help at home as daughter had to return to school for exams. Awaiting SNF placement, has [facility]. Denies N/V/SOB pt with some constipation, NL bowel sounds, + passing gas  BP 102/66  Pulse 65  Temp(Src) 98.3 F (36.8 C) (Oral)  Resp 20  Ht 5\' 7"  (1.702 m)  Wt 94.802 kg (209 lb)  BMI 32.73 kg/m2  SpO2 100%   Pt laying in hospital bed, SCD's in place, Dressing CDI, outer dressing removed, steri strips in place, NVI   Assessment and Plan:   PO Day 5 S/P L5-S1 Decompression and Fusion  Pain controlled cont current medications  Cont PT/OT today, back precautions at all times  DC to SNF [facility] chosen) Pending placement.  D/c order completed in chart  Scripts signed and in chart Bulky external dressing removed, pt can shower normally over steri strips  F/U in office 2 weeks

## 2013-12-31 NOTE — Progress Notes (Signed)
CM asked RN to contact the on[facility]call staff [facility] regarding discharge to home orders. The patient's insurance had not approved SNF placement. The patient and family were ready for discharge to home, and felt safe about the plan. H/H with [facility] set-up, and rolling walker/3-in-1 ordered. Stephens NovemberAndrew Idia PA-C was paged and made aware. Patient will now be sent home

## 2014-01-01 NOTE — Care Management Note (Signed)
CARE MANAGEMENT NOTE 01/01/2014  Patient:  Lavella LemonsCOVINGTON,Stephinie   Account Number:  [id number]  Date Initiated:  12/30/2013  Documentation initiated by:  Vance PeperBRADY,Hafsah Hendler  Subjective/Objective Assessment:   58 yr old female s/p L5-S1 fusion     Action/Plan:   Patient requires shortterm rehab at SNF, would like to go [facility] or [facility]. Case Manager will notify Child psychotherapistocial Worker.   Anticipated DC Date:  12/31/2013   Anticipated DC Plan:  HOME W HOME HEALTH SERVICES  In-house referral  Clinical Social Worker      DC Associate Professorlanning Services  CM consult      [facility]  DURABLE MEDICAL EQUIPMENT   Choice offered to / List presented to:  C-1 Patient   DME arranged  3-N-1  [facility]an HurstWALKER - ROLLING      DME agency  [facility]     HH arranged  HH-2 PT      [facility]   Status of service:  Completed, signed off Medicare Important Message given?   (If response is "NO", the following Medicare IM given date fields will be blank) Date Medicare IM given:   Date Additional Medicare IM given:    Discharge Disposition:  HOME W HOME HEALTH SERVICES

## 2015-08-13 IMAGING — CR DG CHEST 2V
2 series · 2 of 2 positions shown · non-contrast
Comparison: Prior chest x-ray 01/23/2010

CLINICAL DATA: Preoperative chest x-ray

EXAM:
CHEST  2 VIEW

[w chest pa]
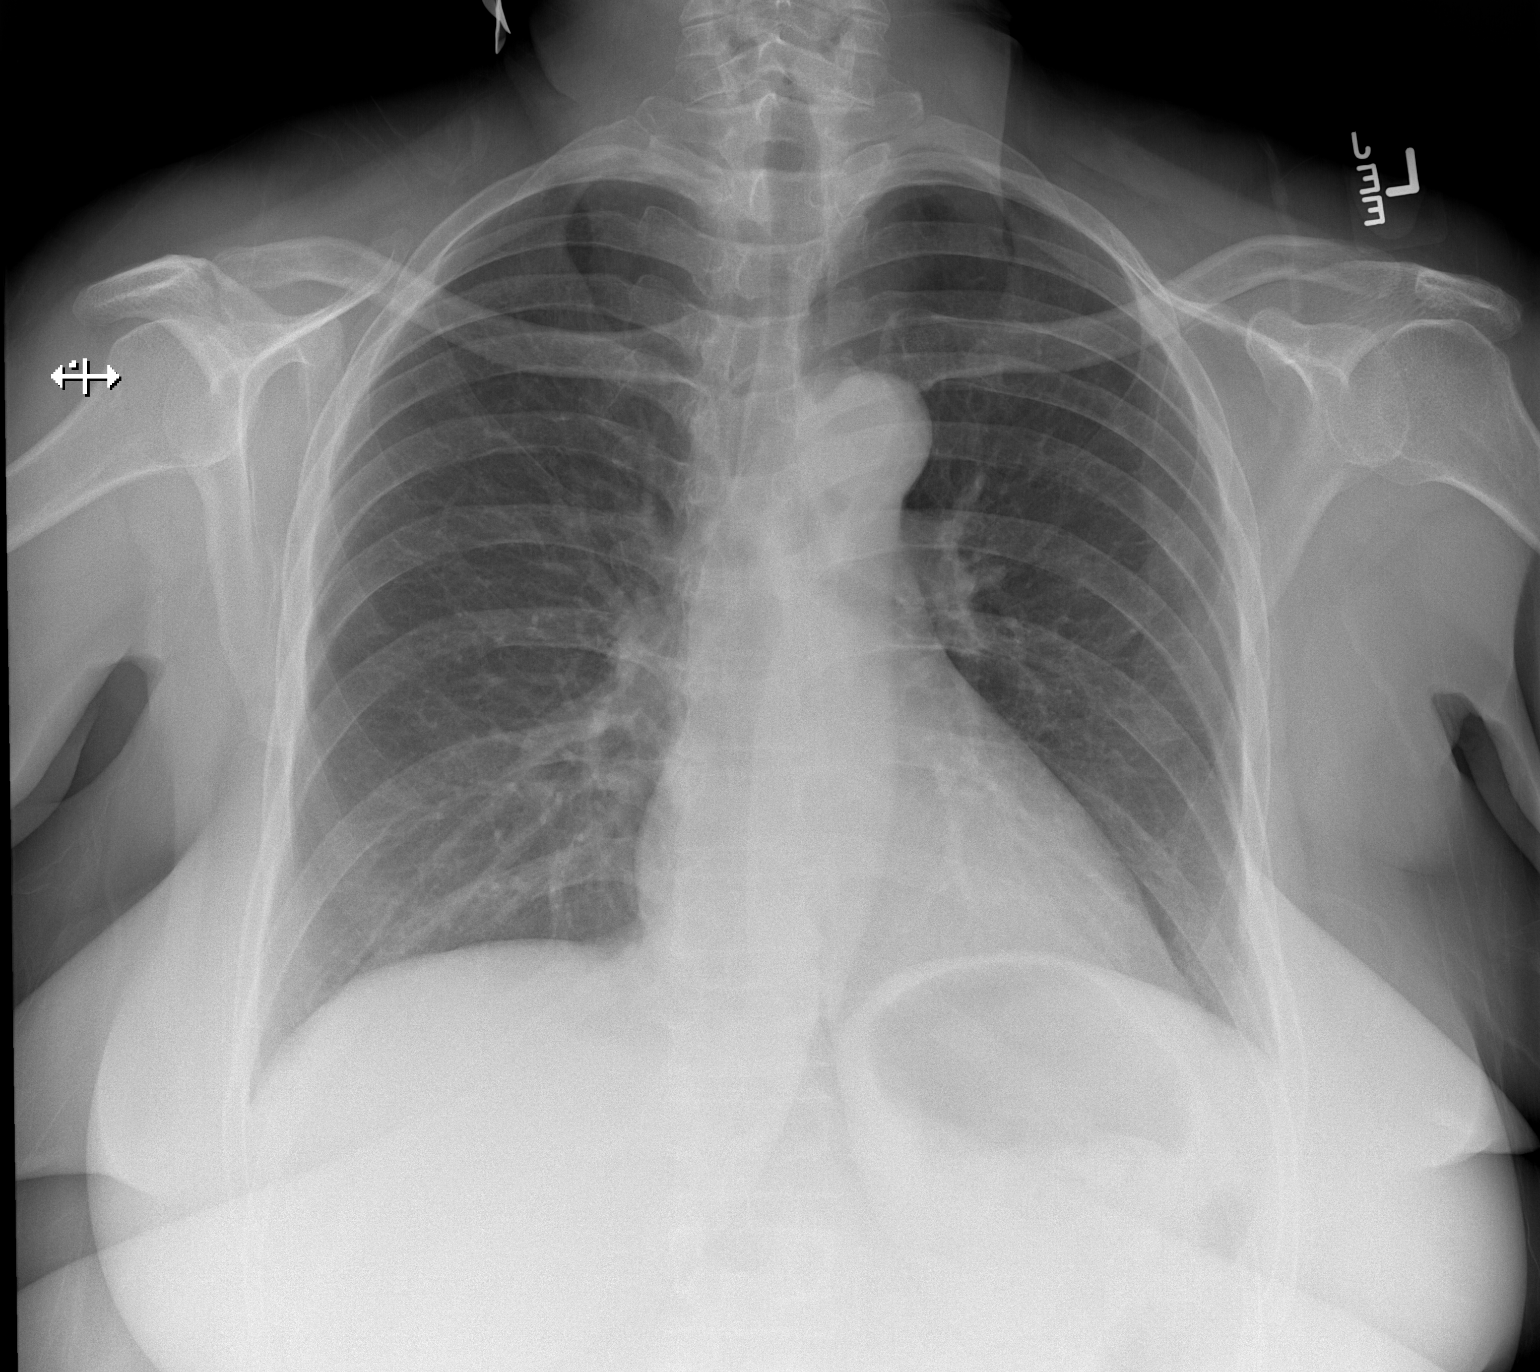

[w chest lat]
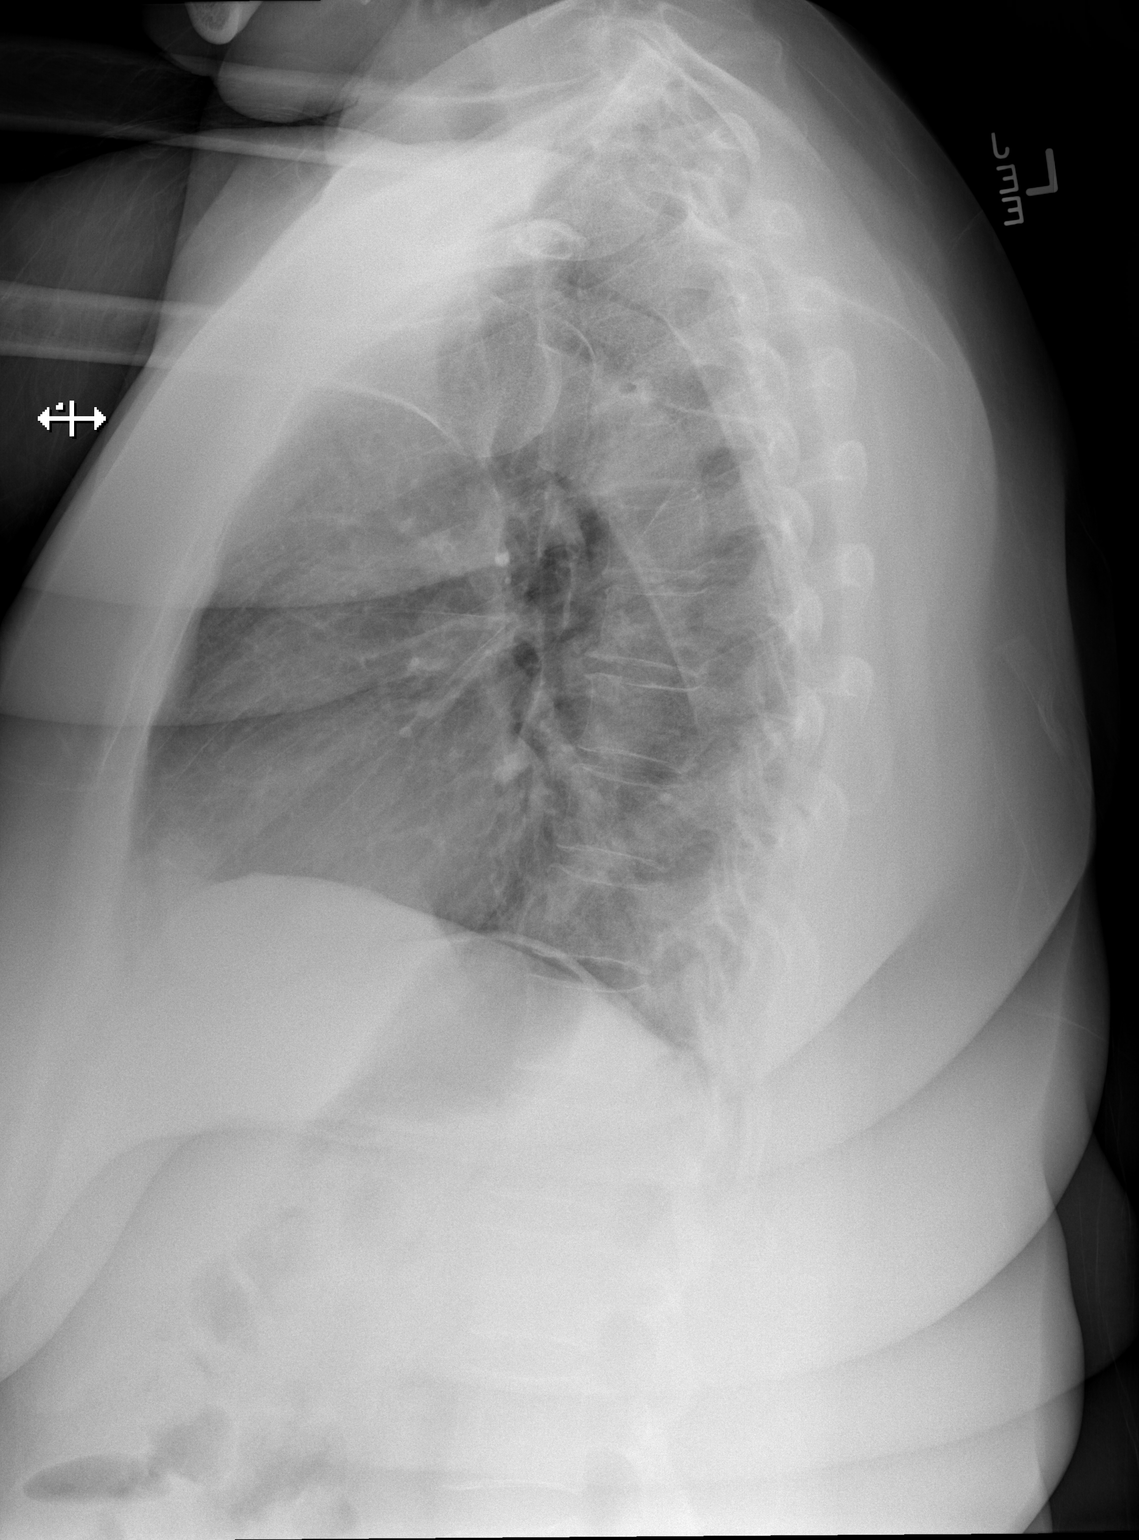

[2 of 2 positions shown; findings below may reference images not displayed]

FINDINGS: The lungs are clear and negative for focal airspace consolidation,
pulmonary edema or suspicious pulmonary nodule. No pleural effusion
or pneumothorax. Cardiac and mediastinal contours are within normal
limits consistent with interval resolution of mild cardiomegaly
compared to prior. No acute fracture or lytic or blastic osseous
lesions. The visualized upper abdominal bowel gas pattern is
unremarkable.
IMPRESSION: No active cardiopulmonary disease.
